# Patient Record
Sex: Male | Born: 1935 | Race: White | Hispanic: No | Smoking: Former smoker
Health system: Southern US, Community
[De-identification: ages and names within clinical notes are randomized; demographics above are authoritative.]

## PROBLEM LIST (undated history)

## (undated) DIAGNOSIS — I10 Essential (primary) hypertension: Secondary | ICD-10-CM

## (undated) DIAGNOSIS — R4182 Altered mental status, unspecified: Secondary | ICD-10-CM

## (undated) DIAGNOSIS — D649 Anemia, unspecified: Secondary | ICD-10-CM

## (undated) DIAGNOSIS — F0280 Dementia in other diseases classified elsewhere without behavioral disturbance: Secondary | ICD-10-CM

## (undated) DIAGNOSIS — G309 Alzheimer's disease, unspecified: Secondary | ICD-10-CM

## (undated) DIAGNOSIS — H547 Unspecified visual loss: Secondary | ICD-10-CM

## (undated) DIAGNOSIS — H9190 Unspecified hearing loss, unspecified ear: Secondary | ICD-10-CM

## (undated) DIAGNOSIS — H3530 Unspecified macular degeneration: Secondary | ICD-10-CM

## (undated) DIAGNOSIS — Z8719 Personal history of other diseases of the digestive system: Secondary | ICD-10-CM

## (undated) DIAGNOSIS — E785 Hyperlipidemia, unspecified: Secondary | ICD-10-CM

## (undated) DIAGNOSIS — K5792 Diverticulitis of intestine, part unspecified, without perforation or abscess without bleeding: Secondary | ICD-10-CM

## (undated) DIAGNOSIS — M1990 Unspecified osteoarthritis, unspecified site: Secondary | ICD-10-CM

## (undated) HISTORY — DX: Diverticulitis of intestine, part unspecified, without perforation or abscess without bleeding: K57.92

## (undated) HISTORY — DX: Essential (primary) hypertension: I10

## (undated) HISTORY — PX: HERNIA REPAIR: SHX51

## (undated) HISTORY — DX: Unspecified osteoarthritis, unspecified site: M19.90

## (undated) HISTORY — PX: LYMPH NODE DISSECTION: SHX5087

## (undated) HISTORY — DX: Hyperlipidemia, unspecified: E78.5

## (undated) HISTORY — DX: Unspecified visual loss: H54.7

## (undated) HISTORY — DX: Alzheimer's disease, unspecified: G30.9

## (undated) HISTORY — DX: Unspecified hearing loss, unspecified ear: H91.90

## (undated) HISTORY — DX: Altered mental status, unspecified: R41.82

## (undated) HISTORY — DX: Dementia in other diseases classified elsewhere, unspecified severity, without behavioral disturbance, psychotic disturbance, mood disturbance, and anxiety: F02.80

## (undated) HISTORY — DX: Personal history of other diseases of the digestive system: Z87.19

## (undated) HISTORY — DX: Unspecified macular degeneration: H35.30

## (undated) HISTORY — DX: Anemia, unspecified: D64.9

## (undated) HISTORY — PX: APPENDECTOMY: SHX54

---

## 1996-03-03 ENCOUNTER — Encounter: Payer: Self-pay | Admitting: Family Medicine

## 1996-03-06 ENCOUNTER — Encounter: Payer: Self-pay | Admitting: Family Medicine

## 1996-03-06 LAB — CONVERTED CEMR LAB: PSA: 2 ng/mL

## 1998-09-02 ENCOUNTER — Encounter: Payer: Self-pay | Admitting: Family Medicine

## 1998-09-02 LAB — CONVERTED CEMR LAB: PSA: 2.6 ng/mL

## 1999-06-29 ENCOUNTER — Encounter (INDEPENDENT_AMBULATORY_CARE_PROVIDER_SITE_OTHER): Payer: Self-pay | Admitting: Specialist

## 1999-06-29 ENCOUNTER — Other Ambulatory Visit: Admission: RE | Admit: 1999-06-29 | Discharge: 1999-06-29 | Payer: Self-pay | Admitting: Internal Medicine

## 1999-06-29 LAB — HM COLONOSCOPY

## 1999-09-03 ENCOUNTER — Encounter: Payer: Self-pay | Admitting: Family Medicine

## 1999-09-29 ENCOUNTER — Encounter: Payer: Self-pay | Admitting: Family Medicine

## 1999-09-29 LAB — CONVERTED CEMR LAB: PSA: 2.4 ng/mL

## 1999-12-04 HISTORY — PX: FEMORAL HERNIA REPAIR: SHX632

## 2000-07-01 ENCOUNTER — Encounter: Admission: RE | Admit: 2000-07-01 | Discharge: 2000-07-01 | Payer: Self-pay | Admitting: Surgery

## 2000-07-01 ENCOUNTER — Encounter: Payer: Self-pay | Admitting: Surgery

## 2000-07-03 ENCOUNTER — Encounter (INDEPENDENT_AMBULATORY_CARE_PROVIDER_SITE_OTHER): Payer: Self-pay | Admitting: *Deleted

## 2000-07-03 ENCOUNTER — Ambulatory Visit (HOSPITAL_BASED_OUTPATIENT_CLINIC_OR_DEPARTMENT_OTHER): Admission: RE | Admit: 2000-07-03 | Discharge: 2000-07-04 | Payer: Self-pay | Admitting: Surgery

## 2000-11-02 ENCOUNTER — Encounter: Payer: Self-pay | Admitting: Family Medicine

## 2000-11-07 ENCOUNTER — Encounter: Payer: Self-pay | Admitting: Family Medicine

## 2001-04-02 ENCOUNTER — Encounter: Payer: Self-pay | Admitting: Family Medicine

## 2001-04-02 LAB — CONVERTED CEMR LAB: PSA: 1.6 ng/mL

## 2001-04-29 ENCOUNTER — Encounter: Payer: Self-pay | Admitting: Family Medicine

## 2002-04-14 ENCOUNTER — Emergency Department (HOSPITAL_COMMUNITY): Admission: EM | Admit: 2002-04-14 | Discharge: 2002-04-14 | Payer: Self-pay | Admitting: Emergency Medicine

## 2002-04-14 ENCOUNTER — Encounter: Payer: Self-pay | Admitting: Emergency Medicine

## 2002-06-02 ENCOUNTER — Encounter: Payer: Self-pay | Admitting: Family Medicine

## 2002-12-03 DIAGNOSIS — Z8719 Personal history of other diseases of the digestive system: Secondary | ICD-10-CM

## 2002-12-03 HISTORY — DX: Personal history of other diseases of the digestive system: Z87.19

## 2003-02-05 ENCOUNTER — Inpatient Hospital Stay (HOSPITAL_COMMUNITY): Admission: EM | Admit: 2003-02-05 | Discharge: 2003-02-08 | Payer: Self-pay | Admitting: Emergency Medicine

## 2003-09-03 ENCOUNTER — Encounter: Payer: Self-pay | Admitting: Family Medicine

## 2003-09-16 ENCOUNTER — Encounter: Payer: Self-pay | Admitting: Family Medicine

## 2003-09-16 LAB — CONVERTED CEMR LAB: TSH: 1.87 microintl units/mL

## 2004-06-21 ENCOUNTER — Emergency Department (HOSPITAL_COMMUNITY): Admission: EM | Admit: 2004-06-21 | Discharge: 2004-06-21 | Payer: Self-pay | Admitting: Emergency Medicine

## 2004-10-31 ENCOUNTER — Ambulatory Visit: Payer: Self-pay | Admitting: Family Medicine

## 2005-09-03 ENCOUNTER — Ambulatory Visit: Payer: Self-pay | Admitting: Family Medicine

## 2005-09-03 LAB — CONVERTED CEMR LAB
Blood Glucose, Fasting: 118 mg/dL
RBC count: 4.61 10*6/uL

## 2005-10-09 ENCOUNTER — Ambulatory Visit: Payer: Self-pay | Admitting: Family Medicine

## 2007-07-07 ENCOUNTER — Telehealth: Payer: Self-pay | Admitting: Family Medicine

## 2007-07-31 ENCOUNTER — Encounter: Payer: Self-pay | Admitting: Family Medicine

## 2007-07-31 DIAGNOSIS — K644 Residual hemorrhoidal skin tags: Secondary | ICD-10-CM | POA: Insufficient documentation

## 2007-07-31 DIAGNOSIS — E785 Hyperlipidemia, unspecified: Secondary | ICD-10-CM | POA: Insufficient documentation

## 2007-07-31 DIAGNOSIS — F528 Other sexual dysfunction not due to a substance or known physiological condition: Secondary | ICD-10-CM | POA: Insufficient documentation

## 2007-07-31 DIAGNOSIS — K5730 Diverticulosis of large intestine without perforation or abscess without bleeding: Secondary | ICD-10-CM | POA: Insufficient documentation

## 2007-07-31 DIAGNOSIS — M1990 Unspecified osteoarthritis, unspecified site: Secondary | ICD-10-CM | POA: Insufficient documentation

## 2007-07-31 DIAGNOSIS — I10 Essential (primary) hypertension: Secondary | ICD-10-CM | POA: Insufficient documentation

## 2007-07-31 DIAGNOSIS — Z8719 Personal history of other diseases of the digestive system: Secondary | ICD-10-CM

## 2007-08-13 ENCOUNTER — Ambulatory Visit: Payer: Self-pay | Admitting: Family Medicine

## 2007-09-26 ENCOUNTER — Ambulatory Visit: Payer: Self-pay | Admitting: Family Medicine

## 2008-09-07 ENCOUNTER — Ambulatory Visit: Payer: Self-pay | Admitting: Family Medicine

## 2008-09-27 ENCOUNTER — Telehealth: Payer: Self-pay | Admitting: Family Medicine

## 2008-09-29 ENCOUNTER — Encounter (INDEPENDENT_AMBULATORY_CARE_PROVIDER_SITE_OTHER): Payer: Self-pay | Admitting: *Deleted

## 2009-03-14 ENCOUNTER — Ambulatory Visit: Payer: Self-pay | Admitting: Family Medicine

## 2009-03-14 LAB — CONVERTED CEMR LAB
ALT: 14 units/L (ref 0–53)
AST: 19 units/L (ref 0–37)
Alkaline Phosphatase: 59 units/L (ref 39–117)
BUN: 22 mg/dL (ref 6–23)
CO2: 32 meq/L (ref 19–32)
Chloride: 105 meq/L (ref 96–112)
Cholesterol: 138 mg/dL (ref 0–200)
Creatinine, Ser: 1.2 mg/dL (ref 0.4–1.5)
Eosinophils Absolute: 0.1 10*3/uL (ref 0.0–0.7)
Glucose, Bld: 90 mg/dL (ref 70–99)
LDL Cholesterol: 83 mg/dL (ref 0–99)
MCHC: 35.2 g/dL (ref 30.0–36.0)
MCV: 82.8 fL (ref 78.0–100.0)
Monocytes Absolute: 0.5 10*3/uL (ref 0.1–1.0)
Neutrophils Relative %: 71.3 % (ref 43.0–77.0)
Platelets: 212 10*3/uL (ref 150.0–400.0)
RDW: 15.2 % — ABNORMAL HIGH (ref 11.5–14.6)
TSH: 1.93 microintl units/mL (ref 0.35–5.50)
Total Bilirubin: 0.8 mg/dL (ref 0.3–1.2)
Total Protein: 6.5 g/dL (ref 6.0–8.3)
Triglycerides: 71 mg/dL (ref 0.0–149.0)
WBC: 6.7 10*3/uL (ref 4.5–10.5)

## 2009-03-16 ENCOUNTER — Ambulatory Visit: Payer: Self-pay | Admitting: Family Medicine

## 2009-04-19 ENCOUNTER — Ambulatory Visit: Payer: Self-pay | Admitting: Internal Medicine

## 2009-04-19 ENCOUNTER — Inpatient Hospital Stay (HOSPITAL_COMMUNITY): Admission: EM | Admit: 2009-04-19 | Discharge: 2009-04-22 | Payer: Self-pay | Admitting: Emergency Medicine

## 2009-06-12 ENCOUNTER — Emergency Department: Payer: Self-pay | Admitting: Emergency Medicine

## 2009-06-13 ENCOUNTER — Telehealth: Payer: Self-pay | Admitting: Internal Medicine

## 2009-06-20 ENCOUNTER — Encounter: Payer: Self-pay | Admitting: Family Medicine

## 2009-06-21 ENCOUNTER — Encounter: Payer: Self-pay | Admitting: Family Medicine

## 2009-06-22 ENCOUNTER — Encounter: Payer: Self-pay | Admitting: Family Medicine

## 2009-06-29 ENCOUNTER — Encounter: Payer: Self-pay | Admitting: Family Medicine

## 2009-06-30 ENCOUNTER — Encounter: Payer: Self-pay | Admitting: Internal Medicine

## 2009-06-30 ENCOUNTER — Ambulatory Visit: Payer: Self-pay | Admitting: Internal Medicine

## 2009-06-30 DIAGNOSIS — G309 Alzheimer's disease, unspecified: Secondary | ICD-10-CM

## 2009-06-30 DIAGNOSIS — F0280 Dementia in other diseases classified elsewhere without behavioral disturbance: Secondary | ICD-10-CM

## 2009-06-30 DIAGNOSIS — R443 Hallucinations, unspecified: Secondary | ICD-10-CM

## 2009-07-20 ENCOUNTER — Telehealth: Payer: Self-pay | Admitting: Internal Medicine

## 2009-07-25 ENCOUNTER — Encounter: Payer: Self-pay | Admitting: Internal Medicine

## 2009-08-12 ENCOUNTER — Encounter: Payer: Self-pay | Admitting: Family Medicine

## 2009-10-06 ENCOUNTER — Encounter: Payer: Self-pay | Admitting: Internal Medicine

## 2009-10-06 ENCOUNTER — Ambulatory Visit: Payer: Self-pay | Admitting: Internal Medicine

## 2009-10-10 ENCOUNTER — Encounter: Payer: Self-pay | Admitting: Family Medicine

## 2009-10-11 ENCOUNTER — Encounter: Payer: Self-pay | Admitting: Family Medicine

## 2009-11-13 ENCOUNTER — Emergency Department: Payer: Self-pay | Admitting: Internal Medicine

## 2009-11-24 ENCOUNTER — Encounter: Payer: Self-pay | Admitting: Internal Medicine

## 2009-12-01 ENCOUNTER — Encounter: Payer: Self-pay | Admitting: Internal Medicine

## 2009-12-06 ENCOUNTER — Encounter: Payer: Self-pay | Admitting: Family Medicine

## 2009-12-08 ENCOUNTER — Encounter: Payer: Self-pay | Admitting: Family Medicine

## 2009-12-20 ENCOUNTER — Encounter: Payer: Self-pay | Admitting: Family Medicine

## 2009-12-20 ENCOUNTER — Ambulatory Visit: Payer: Self-pay | Admitting: Family Medicine

## 2010-01-02 ENCOUNTER — Encounter: Payer: Self-pay | Admitting: Internal Medicine

## 2010-01-02 ENCOUNTER — Telehealth: Payer: Self-pay | Admitting: Internal Medicine

## 2010-02-03 ENCOUNTER — Encounter: Payer: Self-pay | Admitting: Internal Medicine

## 2010-02-15 ENCOUNTER — Ambulatory Visit: Payer: Self-pay | Admitting: Internal Medicine

## 2010-02-22 ENCOUNTER — Encounter: Payer: Self-pay | Admitting: Internal Medicine

## 2010-02-27 ENCOUNTER — Telehealth: Payer: Self-pay | Admitting: Internal Medicine

## 2010-02-27 ENCOUNTER — Encounter: Payer: Self-pay | Admitting: Internal Medicine

## 2010-03-02 ENCOUNTER — Encounter: Payer: Self-pay | Admitting: Internal Medicine

## 2010-03-02 ENCOUNTER — Ambulatory Visit: Payer: Self-pay | Admitting: Internal Medicine

## 2010-03-08 ENCOUNTER — Telehealth: Payer: Self-pay | Admitting: Internal Medicine

## 2010-03-09 ENCOUNTER — Encounter: Payer: Self-pay | Admitting: Internal Medicine

## 2010-03-09 ENCOUNTER — Telehealth: Payer: Self-pay | Admitting: Internal Medicine

## 2010-03-09 ENCOUNTER — Telehealth: Payer: Self-pay | Admitting: Family Medicine

## 2010-03-09 DIAGNOSIS — M25539 Pain in unspecified wrist: Secondary | ICD-10-CM | POA: Insufficient documentation

## 2010-03-27 ENCOUNTER — Encounter: Payer: Self-pay | Admitting: Internal Medicine

## 2010-04-03 ENCOUNTER — Telehealth: Payer: Self-pay | Admitting: Internal Medicine

## 2010-04-03 ENCOUNTER — Encounter: Payer: Self-pay | Admitting: Internal Medicine

## 2010-04-04 ENCOUNTER — Encounter: Payer: Self-pay | Admitting: Internal Medicine

## 2010-04-14 ENCOUNTER — Ambulatory Visit: Payer: Self-pay | Admitting: Internal Medicine

## 2010-05-08 ENCOUNTER — Encounter: Payer: Self-pay | Admitting: Internal Medicine

## 2010-05-16 ENCOUNTER — Encounter: Payer: Self-pay | Admitting: Internal Medicine

## 2010-05-22 ENCOUNTER — Encounter: Payer: Self-pay | Admitting: Internal Medicine

## 2010-06-01 ENCOUNTER — Encounter: Payer: Self-pay | Admitting: Internal Medicine

## 2010-06-19 ENCOUNTER — Ambulatory Visit: Payer: Self-pay | Admitting: Internal Medicine

## 2010-07-27 ENCOUNTER — Ambulatory Visit: Payer: Self-pay | Admitting: Internal Medicine

## 2010-07-27 ENCOUNTER — Encounter: Payer: Self-pay | Admitting: Internal Medicine

## 2010-07-27 DIAGNOSIS — M109 Gout, unspecified: Secondary | ICD-10-CM | POA: Insufficient documentation

## 2010-08-08 ENCOUNTER — Ambulatory Visit: Payer: Self-pay | Admitting: Internal Medicine

## 2010-08-25 ENCOUNTER — Telehealth: Payer: Self-pay | Admitting: Internal Medicine

## 2010-09-02 HISTORY — PX: HIP FRACTURE SURGERY: SHX118

## 2010-09-26 ENCOUNTER — Telehealth: Payer: Self-pay | Admitting: Internal Medicine

## 2010-09-26 ENCOUNTER — Inpatient Hospital Stay: Payer: Self-pay | Admitting: General Practice

## 2010-09-28 ENCOUNTER — Encounter: Payer: Self-pay | Admitting: Internal Medicine

## 2010-09-29 ENCOUNTER — Encounter: Payer: Self-pay | Admitting: Internal Medicine

## 2010-09-29 LAB — PATHOLOGY REPORT

## 2010-12-14 ENCOUNTER — Encounter: Payer: Self-pay | Admitting: Internal Medicine

## 2010-12-15 ENCOUNTER — Telehealth: Payer: Self-pay | Admitting: Internal Medicine

## 2010-12-18 ENCOUNTER — Encounter: Payer: Self-pay | Admitting: Internal Medicine

## 2010-12-21 ENCOUNTER — Encounter: Payer: Self-pay | Admitting: Internal Medicine

## 2010-12-21 ENCOUNTER — Emergency Department: Payer: Self-pay | Admitting: Emergency Medicine

## 2010-12-25 ENCOUNTER — Telehealth: Payer: Self-pay | Admitting: Internal Medicine

## 2010-12-28 ENCOUNTER — Encounter: Payer: Self-pay | Admitting: Internal Medicine

## 2010-12-29 ENCOUNTER — Ambulatory Visit
Admission: RE | Admit: 2010-12-29 | Discharge: 2010-12-29 | Payer: Self-pay | Source: Home / Self Care | Attending: Internal Medicine | Admitting: Internal Medicine

## 2010-12-29 ENCOUNTER — Encounter: Payer: Self-pay | Admitting: Internal Medicine

## 2010-12-29 DIAGNOSIS — R1319 Other dysphagia: Secondary | ICD-10-CM | POA: Insufficient documentation

## 2011-01-01 ENCOUNTER — Telehealth: Payer: Self-pay | Admitting: Internal Medicine

## 2011-01-01 ENCOUNTER — Encounter: Payer: Self-pay | Admitting: Internal Medicine

## 2011-01-02 ENCOUNTER — Telehealth: Payer: Self-pay | Admitting: Internal Medicine

## 2011-01-02 NOTE — Miscellaneous (Signed)
Summary: Materials engineer ,Episode Summary Report  [facility] ,Episode Summary Report   Imported By: Beau Fanny 12/12/2009 15:57:14  _____________________________________________________________________  External Attachment:    Type:   Image     Comment:   External Document

## 2011-01-02 NOTE — Miscellaneous (Signed)
Summary: Episode Summary/[facility]  Ep[facility] Summary/[facility]   Imported By: Lanelle Bal 04/11/2010 10:22:03  _____________________________________________________________________  External Attachment:    Type:   Image     Comment:   External Document

## 2011-01-02 NOTE — Miscellaneous (Signed)
Summary: [facility]   Imported By: Lanelle Bal 05/26/2010 10:22:45  _____________________________________________________________________  External Attachment:    Type:   Image     Comment:   External Document

## 2011-01-02 NOTE — Progress Notes (Signed)
Summary: Rx Theravim-M and Aspirin  Phone Note Refill Request Call back at [phone] Message from:  [facility] on January 02, 2010 5:00 PM  Refills Requested: Medication #1:  BAYER LOW STRENGTH 81 MG  TBEC 1 1 daily by mouth Received refill request for Theravim-m for Multivitamin with miner, take one tablet by mouth once daily for supplement. Forms are in your in box   Method Requested: Fax to [facility] Pharmacy Initial call taken by: Sydell Axon LPN,  January 02, 2010 5:02 PM  Follow-up for Phone Call        okay x 1 year forms signed Follow-up by: Cindee Salt MD,  January 02, 2010 5:25 PM  Additional Follow-up for Phone Call Additional follow up Details #1::        Rx faxed to pharmacy Additional Follow-up by: DeShannon Smith CMA Duncan Dull),  January 02, 2010 5:35 PM    Prescriptions: BAYER LOW STRENGTH 81 MG  TBEC (ASPIRIN) 1 1 daily by mouth  #30 x 12   Entered by:   Mervin Hack CMA (AAMA)   Authorized by:   Cindee Salt MD   Signed by:   Mervin Hack CMA (AAMA) on 01/02/2010   Method used:   Faxed to ...       [facility] (mail-order)       [address]       [place], [place]  [address]       Ph: [phone]       Fax: [phone]   RxID:   [id number] MULTIVITAMINS   TABS (MULTIPLE VITAMIN) 1 once daily  #30 x 12   Entered by:   Mervin Hack CMA (AAMA)   Authorized by:   Cindee Salt MD   Signed by:   Mervin Hack CMA (AAMA) on 01/02/2010   Method used:   Faxed to ...       [facility] (mail-order)       [address]       [place], [place]  [address]       Ph: [phone]       Fax: [phone]   RxID:   [id number]
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
## 2011-01-02 NOTE — Miscellaneous (Signed)
Summary: Care Plan/[facility]  Care Plan[facility]   Imported By: Lanelle Bal 08/14/2010 11:57:38  _____________________________________________________________________  External Attachment:    Type:   Image     Comment:   External Document

## 2011-01-02 NOTE — Miscellaneous (Signed)
Summary: [place]-2, Standing Orders, Care Plan/[facility]  [place], Standing Orders, Care Plan/[facility]   Imported By: Maryln Gottron 06/07/2010 14:01:46  _____________________________________________________________________  External Attachment:    Type:   Image     Comment:   External Document

## 2011-01-02 NOTE — Progress Notes (Signed)
Summary: pt fell, fx'd hip  Phone Note From Other Clinic   Caller: Debbie at [facility]  [phone] Summary of Call: Pt fell last night and broke his hip.  He is at [facility]. Initial call taken by: Lowella Petties CMA, AAMA,  September 26, 2010 10:09 AM  Follow-up for Phone Call        noted will await his rehab status  Please ask Eunice Blase to call when we know where he is going for rehab and whether he is coming back to the [facility] Follow-up by: Cindee Salt MD,  September 26, 2010 10:34 AM  Additional Follow-up for Phone Call Additional follow up Details #1::        Spoke with Eunice Blase and advised results, she will call. Additional Follow-up by: Mervin Hack CMA Duncan Dull),  September 26, 2010 11:57 AM

## 2011-01-02 NOTE — Miscellaneous (Signed)
Summary: Physician Verbal Order/[facility]  Physician Verbal Order/[facility]   Imported By: Maryln Gottron 10/05/2010 15:30:37  _____________________________________________________________________  External Attachment:    Type:   Image     Comment:   External Document

## 2011-01-02 NOTE — Miscellaneous (Signed)
Summary: B12 Order/[facility]  B12 Order/[facility]   Imported By: Lanelle Bal 05/15/2010 08:37:28  _____________________________________________________________________  External Attachment:    Type:   Image     Comment:   External Document

## 2011-01-02 NOTE — Progress Notes (Signed)
Summary: refill request for dermacloud  Phone Note Refill Request Message from:  Fax from Pharmacy  Refills Requested: Medication #1:  dermacloud   Last Refilled: 06/23/2009 Faxed request from [facility] is on your desk, this is not on med list.  Initial call taken by: Lowella Petties CMA,  August 25, 2010 4:54 PM  Follow-up for Phone Call        okay x 1 year Follow-up by: Cindee Salt MD,  August 25, 2010 5:18 PM  Additional Follow-up for Phone Call Additional follow up Details #1::        Completed rx form faxed back to [facility]. Additional Follow-up by: Sydell Axon LPN,  August 28, 2010 9:02 AM    New/Updated Medications: DERMACLOUD  CREA ([employer]ODUCTS) apply to buttocks two times a day as needed Prescriptions: DERMACLOUD  CREA ([employer]ODUCTS) apply to buttocks two times a day as needed  #1 x 11   Entered by:   Sydell Axon LPN   Authorized by:   Cindee Salt MD   Signed by:   Sydell Axon LPN on 16/09/9603   Method used:   Telephoned to ...       [facility]* (retail)       [address]       [place], [place]  [address]       Ph: [phone]       Fax: [phone]   RxID:   [id number]

## 2011-01-02 NOTE — Progress Notes (Signed)
Summary: Cough, fever, congestion  Phone Note Other Incoming   Caller: Debbie @ [facility] [address] [phone] Summary of Call: Eunice Blase calling stating that pt has fever, congestion and cough. Please advise.  ** she states that she has 7 residents with the same symptoms, only 4 are yours. Initial call taken by: Mervin Hack CMA Duncan Dull),  February 27, 2010 9:48 AM  Follow-up for Phone Call        Discussed with Eunice Blase the director  he is the worst of all with chest congestion and wheezing  will use empiric antibiotic and see on Thursday Order sent for z- pak Follow-up by: Cindee Salt MD,  February 27, 2010 2:22 PM  Additional Follow-up for Phone Call Additional follow up Details #1::        order faxed back to debbie [phone] Additional Follow-up by: Mervin Hack CMA Duncan Dull),  February 27, 2010 2:43 PM

## 2011-01-02 NOTE — Medication Information (Signed)
Summary: Fax Regarding Risperidone/[facility]  Fax Regarding Risperidone/[facility]   Imported By: Lanelle Bal 12/16/2009 13:16:39  _____________________________________________________________________  External Attachment:    Type:   Image     Comment:   External Document

## 2011-01-02 NOTE — Miscellaneous (Signed)
Summary: Episode Summary/[facility]  Episode Summary[facility]   Imported By: Lanelle Bal 02/13/2010 11:12:09  _____________________________________________________________________  External Attachment:    Type:   Image     Comment:   External Document

## 2011-01-02 NOTE — Miscellaneous (Signed)
Summary: Request for Medication change/[facility]  Request for Medication change/[facility]   Imported By: Maryln Gottron 02/28/2010 10:02:03  _____________________________________________________________________  External Attachment:    Type:   Image     Comment:   External Document

## 2011-01-02 NOTE — Miscellaneous (Signed)
Summary: Episode Summary/[facility]  Episode Summary[facility]   Imported By: Lanelle Bal 10/10/2010 08:20:57  _____________________________________________________________________  External Attachment:    Type:   Image     Comment:   External Document

## 2011-01-02 NOTE — Miscellaneous (Signed)
Summary: Care Plan/[facility]  Care Plan[facility]   Imported By: Lanelle Bal 06/23/2010 10:19:40  _____________________________________________________________________  External Attachment:    Type:   Image     Comment:   External Document

## 2011-01-02 NOTE — Miscellaneous (Signed)
Summary: Certification and Plan of Care/[facility]  Certification and Plan of Care/[facility]   Imported By: Maryln Gottron 02/20/2010 10:26:11  _____________________________________________________________________  External Attachment:    Type:   Image     Comment:   External Document

## 2011-01-02 NOTE — Miscellaneous (Signed)
Summary: [facility] [place] Orders  [facility] [place] [facility]   Imported By: Beau Fanny 12/12/2009 15:58:02  _____________________________________________________________________  External Attachment:    Type:   Image     Comment:   External Document

## 2011-01-02 NOTE — Miscellaneous (Signed)
Summary: Zpak Order/[facility]  Zpak Order/[facility]   Imported By: Lanelle Bal 03/02/2010 14:04:38  _____________________________________________________________________  External Attachment:    Type:   Image     Comment:   External Document

## 2011-01-02 NOTE — Progress Notes (Signed)
Summary: Cough/Congestion  Phone Note From Other Clinic Call back at ph [phone] fax [phone]   Caller: [facility] Call For: Dr. Alphonsus Sias Summary of Call: Patient has a cough that is not productive and he is very congested, no fever.  Claris Che wants to know if he can have something for the cough and congestion.  Please advise.   Initial call taken by: Linde Gillis CMA Duncan Dull),  March 08, 2010 10:50 AM  Follow-up for Phone Call        Okay robitussin for the cough--isn't this on standing orders??  no meds for congestion--too many side effects Follow-up by: Cindee Salt MD,  March 08, 2010 2:04 PM  Additional Follow-up for Phone Call Additional follow up Details #1::        Spoke with Claris Che and advised results.  Additional Follow-up by: Mervin Hack CMA Duncan Dull),  March 08, 2010 2:57 PM

## 2011-01-02 NOTE — Miscellaneous (Signed)
Summary: Care Plan/[facility]  Care Plan/[facility]   Imported By: Lanelle Bal 04/19/2010 11:59:40  _____________________________________________________________________  External Attachment:    Type:   Image     Comment:   External Document

## 2011-01-02 NOTE — Miscellaneous (Signed)
Summary: D/C Vicodin/[facility]  D/C Vicodin/[facility]   Imported By: Lanelle Bal 01/06/2010 09:49:18  _____________________________________________________________________  External Attachment:    Type:   Image     Comment:   External Document

## 2011-01-02 NOTE — Miscellaneous (Signed)
Summary: Order/[facility]  Order/[facility]   Imported By: Lester Morrison 05/20/2010 11:06:05  _____________________________________________________________________  External Attachment:    Type:   Image     Comment:   External Document

## 2011-01-02 NOTE — Miscellaneous (Signed)
Summary: Risperdal Order/[facility]  Risperdal Order/[facility]   Imported By: Lanelle Bal 04/07/2010 09:16:14  _____________________________________________________________________  External Attachment:    Type:   Image     Comment:   External Document

## 2011-01-02 NOTE — Miscellaneous (Signed)
Summary: Eval Order/[facility]  Eval Order/[facility]   Imported By: Lanelle Bal 04/03/2010 08:44:23  _____________________________________________________________________  External Attachment:    Type:   Image     Comment:   External Document

## 2011-01-02 NOTE — Progress Notes (Signed)
Summary: [facility]  Phone Note Call from Patient Call back at [phone]   Caller: Daughter Summary of Call: Aaron Mose called and she took her father to [facility]. They did xrays and his wrist is not broken . They said that he has bad arthriris and gave him  a RX for Mobic she thinks. She was wondering if this will work for the arthiritis or if you would suggest something else for the arthritis. He couldnt take the celebrex anymore due to bleeding ulcer.  Initial call taken by: Carlton Adam,  March 09, 2010 2:19 PM  Follow-up for Phone Call        he should not take the mobic if he had bleeding with the celebrex---these are very similar meds  will try tramadol 50mg   1 three times a day as needed for pain  please fax order to the [facility] Follow-up by: Cindee Salt MD,  March 09, 2010 3:01 PM  Additional Follow-up for Phone Call Additional follow up Details #1::        [facility] Additional Follow-up by: Mervin Hack CMA Duncan Dull),  March 09, 2010 3:17 PM

## 2011-01-02 NOTE — Miscellaneous (Signed)
Summary: Physician's Orders/[facility]  Physician's Orders/[facility]   Imported By: Maryln Gottron 03/13/2010 15:57:31  _____________________________________________________________________  External Attachment:    Type:   Image     Comment:   External Document

## 2011-01-02 NOTE — Progress Notes (Signed)
Summary: Injury after fall  Phone Note Call from Patient Call back at Home Phone [phone]   Caller: Daughter/Pat Call For: Cindee Salt MD Summary of Call: Remi Deter fell night before last at [facility]  He is now complaining of his hand hurting and does not want anyone to touch it.  He hurt his hand back in October and was taken to the ER, they didn't find any fracture so they just treated it as arthritis.  Please advise, Dr. Alphonsus Sias not in until this afternoon. Initial call taken by: Linde Gillis CMA Duncan Dull),  March 09, 2010 8:19 AM  Follow-up for Phone Call        Have pt seen by [facility]. Order sent. Follow-up by: Shaune Leeks MD,  March 09, 2010 8:35 AM  Additional Follow-up for Phone Call Additional follow up Details #1::        Patient's daughter notified that Shirlee Limerick will be in touch with her regarding the appt. Additional Follow-up by: Sydell Axon LPN,  March 09, 2010 8:38 AM  New Problems: WRIST PAIN [phone])   Additional Follow-up for Phone Call Additional follow up Details #2::    Called [facility] and [facility] neither group would see Mr Cadieux today. [facility] recommended that they take him to the [facility] clinic and they would Xray his hand and refer if needed to the Ortho at [facility]. Daughter will call back with what happened and if they need anything else.  Follow-up by: Carlton Adam,  March 09, 2010 9:03 AM  New Problems: WRIST PAIN [phone])

## 2011-01-02 NOTE — Miscellaneous (Signed)
Summary: Care Plan/[facility]  Care Plan[facility]   Imported By: Lanelle Bal 12/23/2009 09:15:23  _____________________________________________________________________  External Attachment:    Type:   Image     Comment:   External Document

## 2011-01-02 NOTE — Progress Notes (Signed)
Summary: Patient's behavior  Phone Note Other Incoming   Caller: Debbie from [employer] of Call: states pt is becoming more "frisky" trying to touch residents and staff. Eunice Blase thinks it's because he has come off the risperdal. Please advise. Initial call taken by: Mervin Hack CMA Duncan Dull),  Apr 03, 2010 5:33 PM  Follow-up for Phone Call        will go back to risperdal 0.5mg  two times a day   order written Follow-up by: Cindee Salt MD,  Apr 03, 2010 5:46 PM  Additional Follow-up for Phone Call Additional follow up Details #1::        [facility] Additional Follow-up by: Mervin Hack CMA Duncan Dull),  Apr 04, 2010 10:02 AM
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
## 2011-01-04 ENCOUNTER — Encounter: Payer: Self-pay | Admitting: Internal Medicine

## 2011-01-04 NOTE — Miscellaneous (Signed)
Summary: Physician's Orders/[facility]  Physician's Orders/[facility]   Imported By: Maryln Gottron 12/25/2010 09:56:28  _____________________________________________________________________  External Attachment:    Type:   Image     Comment:   External Document

## 2011-01-04 NOTE — Miscellaneous (Signed)
Summary: Orders, FL2 & Verifications & Diet Order/[facility]  Orders, FL2 & Verifications & Diet Order/[facility]   Imported By: Lanelle Bal 12/20/2010 15:52:11  _____________________________________________________________________  External Attachment:    Type:   Image     Comment:   External Document

## 2011-01-04 NOTE — Progress Notes (Signed)
Summary: fax from call a nurse  Phone Note Call from Patient   Caller: Fax from call a nurse  Call For: Cindee Salt MD Summary of Call: Received a fax from call a nurse - Cristalyne w/ [facility]  called to let you know that pt was trying to wak and fell down Patient was sent to [facility] , he has a skin tear on right elbow and was sent back to facility.  Initial call taken by: Melody Comas,  December 25, 2010 12:02 PM  Follow-up for Phone Call        noted Follow-up by: Cindee Salt MD,  December 25, 2010 1:40 PM

## 2011-01-04 NOTE — Progress Notes (Signed)
Summary: needs order for physical therapy  Phone Note From Other Clinic   Caller: Amy Moon, physical therapist at [facility] [phone] Summary of Call: Pt was opened to physical therapy and therapist needs a verbal ok to continue with plan of care- working on transfers, mobility. Initial call taken by: Lowella Petties CMA, AAMA,  December 15, 2010 8:37 AM  Follow-up for Phone Call        okay to proceed as planned Cindee Salt MD  December 15, 2010 9:18 AM   Verbal ok given to therapist, Gaye Alken. Follow-up by: Lowella Petties CMA, AAMA,  December 15, 2010 9:42 AM
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
## 2011-01-04 NOTE — Medication Information (Signed)
Summary: Rxs coverage/[employer]  Rxs coverage/[employer]   Imported By: Lester  12/26/2010 08:04:12  _____________________________________________________________________  External Attachment:    Type:   Image     Comment:   External Document

## 2011-01-04 NOTE — Miscellaneous (Signed)
Summary: Physician's Orders/[facility]  Physician's Orders/[facility]   Imported By: Maryln Gottron 12/26/2010 15:12:01  _____________________________________________________________________  External Attachment:    Type:   Image     Comment:   External Document

## 2011-01-05 ENCOUNTER — Encounter: Payer: Self-pay | Admitting: Internal Medicine

## 2011-01-08 ENCOUNTER — Telehealth: Payer: Self-pay | Admitting: Internal Medicine

## 2011-01-08 ENCOUNTER — Encounter: Payer: Self-pay | Admitting: Internal Medicine

## 2011-01-10 NOTE — Progress Notes (Signed)
Summary: request to change diet  Phone Note From Other Clinic   Caller: Lafe Garin at [facility] Summary of Call: Nurse at [facility] called to report that they want to change pt back to pureed diet, she said he has had complications with soft diet- choking, spitting out food.  She has faxed you a request to change, this is on your desk. Initial call taken by: Lowella Petties CMA, AAMA,  January 01, 2011 2:42 PM  Follow-up for Phone Call        okay to change I felt an attempt at improved diet was warranted  Most likely due to the extent of his dementia Follow-up by: Cindee Salt MD,  January 01, 2011 2:46 PM  Additional Follow-up for Phone Call Additional follow up Details #1::        form faxed back and scanned Additional Follow-up by: Mervin Hack CMA Duncan Dull),  January 01, 2011 2:55 PM

## 2011-01-10 NOTE — Progress Notes (Signed)
Summary: call a nurse   Phone Note Call from Patient   Call For: Cindee Salt MD Summary of Call: Triage Record Num: [id number] Operator: Kathleen Lime Patient Name: [name] Schwartz Call Date & Time: 01/01/2011 9:15:21PM Patient Phone: [phone] PCP: Patient Gender: Male PCP Fax : Patient DOB: 29-Jan-1914 Practice Name: [facility] Reason for Call: Duwayne Heck, Med Techr called from [employer] [facility]. Pt fell unwitnessed from wheelchair. Has some skin tears, lacerations on L hand. They are only in the skin, not deep. Happend tonight. Wound was cleaned and bandaged, Pt given Tylenol for pain. Protocol(s) Used: Abrasions, Lacerations, Puncture Wounds Protocol(s) Used: Hand Injury Recommended Outcome per Protocol: Provide Home/Self Care Reason for Outcome: Cut, abrasion, laceration or puncture wound is the primary concern Wound does not gape with or without pulling, is superficial or with minor bleeding that is easily controlled with pressure Care Advice:  ~ Avoid injury to affected area. Call provider if wound or area around wound becomes increasingly red or painful, there is a purulent or foul smelling discharge, red streaks develop leading away from wound, or if you develop any temperature elevation.  ~  ~ HEALTH PROMOTION / MAINTENANCE  ~ Thoroughly wash hands with soap and water before and after touching the site. Do not pick at any scab that forms over during the healing process. Allow it to fall off naturally. Keep the area clean and dry.  ~ Cleanse with water. Cover with a bandage across the cut to keep edges in contact. Apply direct pressure to the wound to control bleeding.  ~  ~ SYMPTOM / CONDITION MANAGEMENT  ~ CAUTIONS Tetanus immunization must be given as soon as possible after injury, usually within 72 hours, IF: - immunization status is unknown, never immunized, or fewer than 3 doses given, - it has been 10 years or more since last  immunization; - OR if it has been 5 years or more since last booster, AND wound is a deep, is a puncture wound, or is a tetanus-prone wound. Keep an up-to-date record of yo Initial call taken by: Melody Comas,  January 02, 2011 8:38 AM  Follow-up for Phone Call        noted Follow-up by: Cindee Salt MD,  January 02, 2011 8:58 AM

## 2011-01-10 NOTE — Miscellaneous (Signed)
Summary: D N R  D N R   Imported By: Kassie Mends 01/05/2011 10:55:32  _____________________________________________________________________  External Attachment:    Type:   Image     Comment:   External Document

## 2011-01-11 ENCOUNTER — Encounter: Payer: Self-pay | Admitting: Internal Medicine

## 2011-01-12 ENCOUNTER — Encounter: Payer: Self-pay | Admitting: Internal Medicine

## 2011-01-13 ENCOUNTER — Encounter: Payer: Self-pay | Admitting: Internal Medicine

## 2011-01-14 ENCOUNTER — Emergency Department: Payer: Self-pay | Admitting: Emergency Medicine

## 2011-01-15 ENCOUNTER — Telehealth: Payer: Self-pay | Admitting: Internal Medicine

## 2011-01-15 ENCOUNTER — Encounter: Payer: Self-pay | Admitting: Internal Medicine

## 2011-01-18 ENCOUNTER — Encounter: Payer: Self-pay | Admitting: Internal Medicine

## 2011-01-18 DIAGNOSIS — Z471 Aftercare following joint replacement surgery: Secondary | ICD-10-CM

## 2011-01-18 DIAGNOSIS — M1610 Unilateral primary osteoarthritis, unspecified hip: Secondary | ICD-10-CM

## 2011-01-18 DIAGNOSIS — I10 Essential (primary) hypertension: Secondary | ICD-10-CM

## 2011-01-18 DIAGNOSIS — IMO0001 Reserved for inherently not codable concepts without codable children: Secondary | ICD-10-CM

## 2011-01-18 DIAGNOSIS — M6281 Muscle weakness (generalized): Secondary | ICD-10-CM

## 2011-01-18 DIAGNOSIS — F0280 Dementia in other diseases classified elsewhere without behavioral disturbance: Secondary | ICD-10-CM

## 2011-01-18 DIAGNOSIS — G309 Alzheimer's disease, unspecified: Secondary | ICD-10-CM

## 2011-01-18 NOTE — Progress Notes (Signed)
Summary: daughter wants to change prevacid  Phone Note Call from Patient Call back at Home Phone [phone]   Caller: Daughter  Alfonso Ellis Summary of Call: Pt's daughter is asking that prevacid be changed to omeprazole, which would be less costly.  Please fax order to [employer] and let daughter know  Also, call from [place], pt slid out of his wheelchair onto the floor, but he is ok, no injuries. Initial call taken by: Lowella Petties CMA, AAMA,  January 08, 2011 9:47 AM  Follow-up for Phone Call        will change they may have to crush tab if allowed--otherwise try liquid (may have to be pantoprazole then  Please check with pharmacist about whether omeprazole can be crushed and if not, which PPI has  liquid form Follow-up by: Cindee Salt MD,  January 08, 2011 1:31 PM  Additional Follow-up for Phone Call Additional follow up Details #1::        left message on machine at home for patient to return my call.  DeShannon Smith CMA Duncan Dull)  January 08, 2011 3:47 PM   spoke with pharmacist at [facility], she states that omeprazole capsule can be opened up and sprinkled onto food. Nexium has unit powder packs for suspension.  DeShannon Smith CMA (AAMA)  January 09, 2011 8:30 AM   Orders changed to omeprazole Cindee Salt MD  January 09, 2011 8:41 AM   orders faxed back to [facility] and rx faxed to [facility] and scanned Additional Follow-up by: Mervin Hack CMA Duncan Dull),  January 09, 2011 9:01 AM    New/Updated Medications: OMEPRAZOLE 20 MG CPDR (OMEPRAZOLE) 1 tab daily  Appended Document: daughter wants to change prevacid daughter called back and I advised results

## 2011-01-18 NOTE — Miscellaneous (Signed)
Summary: [facility] Physician Orders  [facility] Physician Orders   Imported By: Kassie Mends 01/10/2011 10:09:18  _____________________________________________________________________  External Attachment:    Type:   Image     Comment:   External Document

## 2011-01-18 NOTE — Progress Notes (Signed)
Summary: call a nurse   Phone Note Call from Patient   Call For: Cindee Salt MD Summary of Call: Triage Record Num: [id number] Operator: Kerby Moors Patient Name: [name] Schwartz Call Date & Time: 01/05/2011 10:15:24PM Patient Phone: [phone] PCP: Tillman Abide Patient Gender: Male PCP Fax : [phone] Patient DOB: [birth year] Practice Name: [facility] Reason for Call: [employer] calling from [facility] to report that pt was found sitting in the floor on his bottom. Vitals are stable(129/108-historically runs high, 82, 24, 97.6), no redness or bruising, no complaints of pain. Pt did reopen skin tear on Right elbow, area was cleaned and dressing applied. All emergent sxs r/o per protocol, advised to f/u with office on Monday. Also wants to make office aware that she faxed a note about insurance not covering Prevacid and with suggested recommendations that insurance will cover. Protocol(s) Used: Falls Recommended Outcome per Protocol: See Provider within 2 Weeks Reason for Outcome: Potential for nutritional deficiencies Care Advice:  ~ 02/ Initial call taken by: Melody Comas,  January 08, 2011 8:14 AM  Follow-up for Phone Call        Noted will await fax about prevacid Follow-up by: Cindee Salt MD,  January 08, 2011 10:05 AM

## 2011-01-18 NOTE — Miscellaneous (Signed)
Summary: D N R  D N R   Imported By: Kassie Mends 01/08/2011 11:44:42  _____________________________________________________________________  External Attachment:    Type:   Image     Comment:   External Document

## 2011-01-18 NOTE — Miscellaneous (Signed)
Summary: [name] [place] (Pt Orders)  [name] [place] (Pt Orders)   Imported By: Kassie Mends 01/08/2011 10:08:54  _____________________________________________________________________  External Attachment:    Type:   Image     Comment:   External Document

## 2011-01-18 NOTE — Miscellaneous (Signed)
Summary: Advertising copywriter  Plan of Care  [facility]  Plan of Care   Imported By: Kassie Mends 01/10/2011 11:40:10  _____________________________________________________________________  External Attachment:    Type:   Image     Comment:   External Document

## 2011-01-18 NOTE — Miscellaneous (Signed)
Summary: [facility] MD Order Sheet  [facility] MD Order Sheet   Imported By: Kassie Mends 01/08/2011 11:45:32  _____________________________________________________________________  External Attachment:    Type:   Image     Comment:   External Document

## 2011-01-18 NOTE — Miscellaneous (Signed)
Summary: [facility] MD Diet Order  [facility] MD Diet Order   Imported By: Kassie Mends 01/08/2011 11:43:49  _____________________________________________________________________  External Attachment:    Type:   Image     Comment:   External Document

## 2011-01-18 NOTE — Progress Notes (Signed)
Summary: Risperidone  Phone Note Refill Request Message from:  Fax from Pharmacy on January 08, 2011 4:46 PM  Refills Requested: Medication #1:  RISPERIDONE 0.5 MG TABS take 1 by mouth once daily at bedtime [facility]   Phone:   [phone]   Method Requested: Telephone to Pharmacy Initial call taken by: Delilah Shan CMA [facility]),  January 08, 2011 4:47 PM  Follow-up for Phone Call        No fax but okay to refill for 6 months Follow-up by: Cindee Salt MD,  January 08, 2011 5:54 PM  Additional Follow-up for Phone Call Additional follow up Details #1::        Rx faxed to pharmacy Additional Follow-up by: DeShannon Smith CMA [facility]),  January 09, 2011 8:31 AM    Prescriptions: RISPERIDONE 0.5 MG TABS (RISPERIDONE) take 1 by mouth once daily at bedtime  #30 x 6   Entered by:   Mervin Hack CMA (AAMA)   Authorized by:   Cindee Salt MD   Signed by:   Mervin Hack CMA (AAMA) on 01/09/2011   Method used:   Electronically to        [facility]* (retail)       [address]       [place], [place]  [address]       Ph: [phone]       Fax: [phone]   RxID:   [id number]

## 2011-01-19 ENCOUNTER — Encounter: Payer: Self-pay | Admitting: Internal Medicine

## 2011-01-23 ENCOUNTER — Telehealth: Payer: Self-pay | Admitting: Internal Medicine

## 2011-01-23 ENCOUNTER — Emergency Department: Payer: Self-pay | Admitting: Unknown Physician Specialty

## 2011-01-24 ENCOUNTER — Encounter: Payer: Self-pay | Admitting: Internal Medicine

## 2011-01-24 NOTE — Miscellaneous (Signed)
Summary: Office Message for MD  Office Message for MD   Imported By: Kassie Mends 01/16/2011 08:33:25  _____________________________________________________________________  External Attachment:    Type:   Image     Comment:   External Document

## 2011-01-24 NOTE — Progress Notes (Signed)
Summary: call a nurse   Phone Note Call from Patient   Call For: Cindee Salt MD Summary of Call: Triage Record Num: [id number] Operator: Sula Rumple Patient Name: [name] Schwartz Call Date & Time: 01/14/2011 11:27:19PM Patient Phone: [phone] PCP: Tillman Abide Patient Gender: Male PCP Fax : [phone] Patient DOB: November 19, 1914 [facility] Name[facility] Reason for Call: Cristalin/ CNA /calling on 01/14/11 from [facility]/states pt was sent out/pt was found on the floor/head bleeding/126/80/Pulse 83/ respiration 36/temp normal./sent to ER per EMS/calling to report fall only. Protocol(s) Used: Head Injury Recommended Outcome per Protocol: See ED Immediately Reason for Outcome: Head injury AND 75 years of age or older Care Advice:  ~ 02/ Initial call taken by: Melody Comas,  January 15, 2011 8:32 AM  Follow-up for Phone Call        Please check on him Cindee Salt MD  January 15, 2011 10:14 AM   order faxed back to [facility] and scanned Follow-up by: Mervin Hack CMA [facility]),  January 15, 2011 2:06 PM

## 2011-01-24 NOTE — Miscellaneous (Signed)
Summary: [facility] MD Order  [facility] MD Order   Imported By: Kassie Mends 01/15/2011 09:59:07  _____________________________________________________________________  External Attachment:    Type:   Image     Comment:   External Document

## 2011-01-24 NOTE — Letter (Signed)
SummaryScientist, physiological [facility]   [facility]   Imported By: Kassie Mends 01/19/2011 09:10:27  _____________________________________________________________________  External Attachment:    Type:   Image     Comment:   External Document

## 2011-01-24 NOTE — Medication Information (Signed)
Summary: Non Covered Medication note   Non Covered Medication note   Imported By: Kassie Mends 01/15/2011 10:00:14  _____________________________________________________________________  External Attachment:    Type:   Image     Comment:   External Document

## 2011-01-24 NOTE — Miscellaneous (Signed)
[facility]   Imported By: Kassie Mends 01/19/2011 09:12:12  _____________________________________________________________________  External Attachment:    Type:   Image     Comment:   External Document

## 2011-01-24 NOTE — Miscellaneous (Signed)
Summary: Audiological scientist Living Physician Order Insurance risk surveyor Living Physician Order Sheet   Imported By: Kassie Mends 01/19/2011 09:13:31  _____________________________________________________________________  External Attachment:    Type:   Image     Comment:   External Document

## 2011-01-24 NOTE — Miscellaneous (Signed)
Summary: Plan of Treatment  Plan of Treatment   Imported By: Kassie Mends 01/16/2011 08:31:44  _____________________________________________________________________  External Attachment:    Type:   Image     Comment:   External Document

## 2011-01-29 ENCOUNTER — Encounter: Payer: Self-pay | Admitting: Internal Medicine

## 2011-01-30 NOTE — Progress Notes (Signed)
Summary: does patient need to see you  Phone Note Call from Patient   Caller: Tammy Sours from [facility] [phone] Call For: Cindee Salt MD Summary of Call: Patient was sent to ER after falling and hitting his head. He was told to follow up with Dr. Alphonsus Sias in 1-2 days. Patient has no injuries, no fractures, is feeling fine. Tammy Sours is asking if you want to see him in a couple of days or if it is okay to wait and see you at his next follow up which is scheduled for April. Please advise.  Initial call taken by: Melody Comas,  January 23, 2011 3:41 PM  Follow-up for Phone Call        Okay to wait for next appt unless they note anything of concern Follow-up by: Cindee Salt MD,  January 24, 2011 7:56 AM  Additional Follow-up for Phone Call Additional follow up Details #1::        spoke with Tammy Sours from [facility] and advised results. Additional Follow-up by: Mervin Hack CMA [facility]),  January 24, 2011 10:47 AM

## 2011-01-30 NOTE — Miscellaneous (Signed)
Summary: PT Evaluation  PT Evaluation   Imported By: Kassie Mends 01/24/2011 09:34:32  _____________________________________________________________________  External Attachment:    Type:   Image     Comment:   External Document

## 2011-01-30 NOTE — Miscellaneous (Signed)
Summary: OT Orders & Care Plan/[facility]  OT Orders & Care Plan/[facility]   Imported By: Lanelle Bal 01/22/2011 15:02:05  _____________________________________________________________________  External Attachment:    Type:   Image     Comment:   External Document

## 2011-01-31 ENCOUNTER — Encounter: Payer: Self-pay | Admitting: Internal Medicine

## 2011-02-01 ENCOUNTER — Encounter: Payer: Self-pay | Admitting: Internal Medicine

## 2011-02-08 NOTE — Miscellaneous (Signed)
Summary: Discharge from OT/[facility]  Discharge from OT/[facility]   Imported By: Maryln Gottron 01/31/2011 10:54:57  _____________________________________________________________________  External Attachment:    Type:   Image     Comment:   External Document

## 2011-02-08 NOTE — Miscellaneous (Signed)
Summary: Physician's Orders/[facility]  Physician's Orders/[facility]   Imported By: Maryln Gottron 01/29/2011 15:54:15  _____________________________________________________________________  External Attachment:    Type:   Image     Comment:   External Document

## 2011-02-08 NOTE — Miscellaneous (Signed)
Summary: PT/[facility]  PT/[facility]   Imported By: Sherian Rein 01/29/2011 09:24:21  _____________________________________________________________________  External Attachment:    Type:   Image     Comment:   External Document

## 2011-02-12 ENCOUNTER — Encounter: Payer: Self-pay | Admitting: Internal Medicine

## 2011-02-20 NOTE — Miscellaneous (Signed)
Summary: MD Orders   MD Orders   Imported By: Kassie Mends 02/14/2011 08:11:45  _____________________________________________________________________  External Attachment:    Type:   Image     Comment:   External Document

## 2011-02-20 NOTE — Miscellaneous (Signed)
Summary: [name] [facility] MD Orders  [name] [facility] of [place] MD Orders   Imported By: Kassie Mends 02/14/2011 08:11:06  _____________________________________________________________________  External Attachment:    Type:   Image     Comment:   External Document

## 2011-02-26 ENCOUNTER — Telehealth: Payer: Self-pay | Admitting: *Deleted

## 2011-02-26 NOTE — Telephone Encounter (Signed)
Triage Record Num: [id number] Operator: Freddie Breech Patient Name: Lawrnce Reyez Call Date & Time: 02/24/2011 5:16:05PM Patient Phone: [phone] PCP: Tillman Abide Patient Gender: Male PCP Fax : [phone] Patient DOB: 01/05/1914 [facility] Reason for Call: Cristalyn, Med Tech is calling as pt had a fall today. No LOC, lac, bruising or any obvious injury. Alert per baseline. Eating supper at present. Home care advised. Protocol(s) Used: Falls Recommended Outcome per Protocol: Provide Home/Self Care Reason for Outcome: History of falls due to environmental factors AND had previous evaluation Care Advice: ~ Call provider if symptoms continue, worsen, or new symptoms develop. 03/

## 2011-02-26 NOTE — Telephone Encounter (Signed)
No action needed Staff there will observe

## 2011-03-12 ENCOUNTER — Telehealth: Payer: Self-pay | Admitting: *Deleted

## 2011-03-12 NOTE — Telephone Encounter (Signed)
Triage Record Num: [id number] Operator: April Finney Patient Name: [name] Schwartz Call Date & Time: 03/08/2011 9:58:41PM Patient Phone: [phone] PCP: Tillman Abide Patient Gender: Male PCP Fax : [phone] Patient DOB: 1914-04-22 [facility] Name[facility] Reason for Call: Amanda/[facility] [phone] calling about last balance and fell in floor. Has carpet burn to right knee that has been cleaned. No bleeding. No other injuries. Afebrile. No emergent symptoms. See within 2 wks care advice given. Protocol(s) Used: Falls Recommended Outcome per Protocol: See Provider within 2 Weeks Reason for Outcome: At risk for recurrent falling Care Advice: ~ Call provider if symptoms continue, worsen, or new symptoms develop. ~ SYMPTOM / CONDITION MANAGEMENT ~ SAFETY / PROTECTION ~ CAUTIONS 03/08/2011 10:07:43PM Page 1 of 1 [id number]

## 2011-03-13 LAB — DIFFERENTIAL
Basophils Absolute: 0.2 10*3/uL — ABNORMAL HIGH (ref 0.0–0.1)
Basophils Relative: 2 % — ABNORMAL HIGH (ref 0–1)
Monocytes Relative: 7 % (ref 3–12)
Neutro Abs: 8.5 10*3/uL — ABNORMAL HIGH (ref 1.7–7.7)
Neutrophils Relative %: 86 % — ABNORMAL HIGH (ref 43–77)

## 2011-03-13 LAB — URINE CULTURE
Colony Count: NO GROWTH
Culture: NO GROWTH

## 2011-03-13 LAB — URINALYSIS, ROUTINE W REFLEX MICROSCOPIC
Bilirubin Urine: NEGATIVE
Ketones, ur: NEGATIVE mg/dL
Nitrite: NEGATIVE
Urobilinogen, UA: 1 mg/dL (ref 0.0–1.0)

## 2011-03-13 LAB — COMPREHENSIVE METABOLIC PANEL
Alkaline Phosphatase: 64 U/L (ref 39–117)
BUN: 17 mg/dL (ref 6–23)
Creatinine, Ser: 1.09 mg/dL (ref 0.4–1.5)
Glucose, Bld: 151 mg/dL — ABNORMAL HIGH (ref 70–99)
Potassium: 3.6 mEq/L (ref 3.5–5.1)
Total Bilirubin: 1.5 mg/dL — ABNORMAL HIGH (ref 0.3–1.2)
Total Protein: 6 g/dL (ref 6.0–8.3)

## 2011-03-13 LAB — PROTIME-INR: INR: 1.3 (ref 0.00–1.49)

## 2011-03-13 LAB — APTT: aPTT: 36 seconds (ref 24–37)

## 2011-03-13 LAB — CBC
HCT: 32 % — ABNORMAL LOW (ref 39.0–52.0)
HCT: 35.7 % — ABNORMAL LOW (ref 39.0–52.0)
Hemoglobin: 11 g/dL — ABNORMAL LOW (ref 13.0–17.0)
Hemoglobin: 12.1 g/dL — ABNORMAL LOW (ref 13.0–17.0)
MCHC: 34.4 g/dL (ref 30.0–36.0)
MCV: 84.2 fL (ref 78.0–100.0)
MCV: 84.9 fL (ref 78.0–100.0)
Platelets: 178 10*3/uL (ref 150–400)
RDW: 15.7 % — ABNORMAL HIGH (ref 11.5–15.5)
RDW: 15.9 % — ABNORMAL HIGH (ref 11.5–15.5)

## 2011-03-13 LAB — VITAMIN B12: Vitamin B-12: 249 pg/mL (ref 211–911)

## 2011-03-14 NOTE — Telephone Encounter (Signed)
noted 

## 2011-03-15 ENCOUNTER — Emergency Department: Payer: Self-pay | Admitting: Emergency Medicine

## 2011-03-15 ENCOUNTER — Telehealth: Payer: Self-pay | Admitting: *Deleted

## 2011-03-15 NOTE — Telephone Encounter (Signed)
Noted Doesn't sound too serious

## 2011-03-15 NOTE — Telephone Encounter (Signed)
Nurse at [facility] called to report that pt fell this morning, has a skin tear.  They are sending him to ER for evaluation.

## 2011-03-19 ENCOUNTER — Encounter: Payer: Self-pay | Admitting: Internal Medicine

## 2011-03-19 ENCOUNTER — Ambulatory Visit (INDEPENDENT_AMBULATORY_CARE_PROVIDER_SITE_OTHER): Payer: Medicare Other | Admitting: Internal Medicine

## 2011-03-19 VITALS — BP 100/58 | HR 54 | Temp 97.6°F

## 2011-03-19 DIAGNOSIS — F0280 Dementia in other diseases classified elsewhere without behavioral disturbance: Secondary | ICD-10-CM

## 2011-03-19 DIAGNOSIS — R1319 Other dysphagia: Secondary | ICD-10-CM

## 2011-03-19 DIAGNOSIS — S61409A Unspecified open wound of unspecified hand, initial encounter: Secondary | ICD-10-CM

## 2011-03-19 DIAGNOSIS — S61419A Laceration without foreign body of unspecified hand, initial encounter: Secondary | ICD-10-CM

## 2011-03-19 DIAGNOSIS — R443 Hallucinations, unspecified: Secondary | ICD-10-CM

## 2011-03-19 DIAGNOSIS — I10 Essential (primary) hypertension: Secondary | ICD-10-CM

## 2011-03-19 NOTE — Progress Notes (Signed)
Subjective:    Patient ID: [name] Schwartz, male    DOB: 09-Jul-1914, 77 y.o.   MRN: [id number]  HPI Son and daughter here again [facility] on the 12th Repetitive falls--various times He tries to get out of bed on his own--usually found by his bedside Stitches put in in ER--left wrist  Vision remains poor due to macular degeneration May add to problems as he gets out of bed  Back on pureed diet Couldn't tolerate mech soft Needs assist with eating by staff  Son feels his memory is better off the aricept No apparent hallucinations or psychosis "Fidgety" at times, per staff (daughter notes)  Current outpatient prescriptions:acetaminophen (TYLENOL) 500 MG tablet, Take 500 mg by mouth every 4 (four) hours as needed.  , Disp: , Rfl: ;  aspirin 81 MG tablet, Take 81 mg by mouth daily.  , Disp: , Rfl: ;  atenolol (TENORMIN) 25 MG tablet, Take 25 mg by mouth daily.  , Disp: , Rfl: ;  bisacodyl (DULCOLAX) 10 MG suppository, Place 10 mg rectally as needed.  , Disp: , Rfl:  Ca Carbonate-Mag Hydroxide (MYLANTA ULTRA) 700-300 MG CHEW, Chew by mouth.  , Disp: , Rfl: ;  calcium-vitamin D (OSCAL WITH D 500-200) 500-200 MG-UNIT per tablet, Take 1 tablet by mouth daily.  , Disp: , Rfl: ;  Cholecalciferol (VITAMIN D3) 2000 UNITS TABS, Take by mouth daily.  , Disp: , Rfl: ;  loratadine (CLARITIN) 10 MG tablet, Take 10 mg by mouth daily as needed.  , Disp: , Rfl:  omeprazole (PRILOSEC) 20 MG capsule, Take 20 mg by mouth daily.  , Disp: , Rfl: ;  risperiDONE (RISPERDAL) 0.5 MG tablet, Take 0.5 mg by mouth daily.  , Disp: , Rfl: ;  senna (SENOKOT) 8.6 MG tablet, Take 1 tablet by mouth 2 (two) times daily.  , Disp: , Rfl: ;  traMADol (ULTRAM) 50 MG tablet, Take 50 mg by mouth every 6 (six) hours as needed.  , Disp: , Rfl: ;  Zinc Oxide 25 % PSTE, Apply topically 3 (three) times daily as needed.  , Disp: , Rfl:   Past Medical History  Diagnosis Date  . Diverticulitis   . Hyperlipidemia   .  Hypertension   . Osteoarthritis   . Gout   . History of gastrointestinal hemorrhage 2004    Past Surgical History  Procedure Date  . Appendectomy   . Hernia repair   . Lymph node dissection     right neck  . Hernia repair     right  . Femoral hernia repair 2001  . Hip fracture surgery 10/11    Family History  Problem Relation Age of Onset  . Stroke Neg Hx   . Depression Neg Hx   . Cancer Neg Hx   . Diabetes Brother   . Alzheimer's disease Brother   . Diabetes Sister     History   Social History  . Marital Status: Widowed    Spouse Name: N/A    Number of Children: 2  . Years of Education: N/A   Occupational History  . retired    Social History Main Topics  . Smoking status: Former Games developer  . Smokeless tobacco: Not on file  . Alcohol Use: No  . Drug Use: Not on file  . Sexually Active: Not on file   Other Topics Concern  . Not on file   Social History Narrative   No health care power of attorney, discussed DNR-  daughter requests   Review of Systems Weight seems to be going up Sleeps okay but is up to void---may be the times he is falling    Objective:   Physical Exam  Constitutional: He appears well-developed. No distress.  Neck: Normal range of motion. Neck supple.  Cardiovascular: Normal rate, regular rhythm and normal heart sounds.  Exam reveals no gallop.   No murmur heard.      occ extra beats  Pulmonary/Chest: Effort normal and breath sounds normal. No respiratory distress. He has no wheezes. He has no rales.  Abdominal: Soft. There is no tenderness.  Lymphadenopathy:    He has no cervical adenopathy.  Neurological:       Alert but doesn't really engage No spontaneous speech Doesn't offer any even during exam  Skin:       ~8cm laceration across extensor left thumb to wrist Clean and well apposed No signs of infection          Assessment & Plan:

## 2011-04-11 ENCOUNTER — Emergency Department: Payer: Self-pay | Admitting: Emergency Medicine

## 2011-04-13 ENCOUNTER — Encounter: Payer: Self-pay | Admitting: Internal Medicine

## 2011-04-16 ENCOUNTER — Encounter: Payer: Self-pay | Admitting: Internal Medicine

## 2011-04-16 ENCOUNTER — Ambulatory Visit (INDEPENDENT_AMBULATORY_CARE_PROVIDER_SITE_OTHER): Payer: Medicare Other | Admitting: Internal Medicine

## 2011-04-16 VITALS — BP 100/60 | HR 61 | Temp 97.7°F

## 2011-04-16 DIAGNOSIS — S61219A Laceration without foreign body of unspecified finger without damage to nail, initial encounter: Secondary | ICD-10-CM

## 2011-04-16 DIAGNOSIS — R443 Hallucinations, unspecified: Secondary | ICD-10-CM

## 2011-04-16 DIAGNOSIS — S61209A Unspecified open wound of unspecified finger without damage to nail, initial encounter: Secondary | ICD-10-CM

## 2011-04-16 DIAGNOSIS — F0280 Dementia in other diseases classified elsewhere without behavioral disturbance: Secondary | ICD-10-CM

## 2011-04-16 NOTE — Progress Notes (Signed)
Subjective:    Patient ID: [name] Schwartz, male    DOB: 24-Oct-1914, 77 y.o.   MRN: [id number]  HPI Another fall on May 9th ??fracture of right 4th finger Apparently had sutures put in   No apparent pain  Eating okay  Current outpatient prescriptions:acetaminophen (TYLENOL) 500 MG tablet, Take 500 mg by mouth every 4 (four) hours as needed.  , Disp: , Rfl: ;  aspirin 81 MG tablet, Take 81 mg by mouth daily.  , Disp: , Rfl: ;  atenolol (TENORMIN) 25 MG tablet, Take 25 mg by mouth daily.  , Disp: , Rfl: ;  bisacodyl (DULCOLAX) 10 MG suppository, Place 10 mg rectally as needed.  , Disp: , Rfl:  Ca Carbonate-Mag Hydroxide (MYLANTA ULTRA) 700-300 MG CHEW, Chew by mouth.  , Disp: , Rfl: ;  calcium-vitamin D (OSCAL WITH D 500-200) 500-200 MG-UNIT per tablet, Take 1 tablet by mouth daily.  , Disp: , Rfl: ;  Cholecalciferol (VITAMIN D3) 2000 UNITS TABS, Take by mouth daily.  , Disp: , Rfl: ;  loratadine (CLARITIN) 10 MG tablet, Take 10 mg by mouth daily as needed.  , Disp: , Rfl:  omeprazole (PRILOSEC) 20 MG capsule, Take 20 mg by mouth daily.  , Disp: , Rfl: ;  risperiDONE (RISPERDAL) 0.5 MG tablet, Take 1 by mouth once daily at bedtime, Disp: , Rfl: ;  senna (SENOKOT) 8.6 MG tablet, Take 1 tablet by mouth 2 (two) times daily.  , Disp: , Rfl: ;  traMADol (ULTRAM) 50 MG tablet, Take 50 mg by mouth every 6 (six) hours as needed.  , Disp: , Rfl:  Zinc Oxide 25 % PSTE, Apply topically 3 (three) times daily as needed.  , Disp: , Rfl:   Past Medical History  Diagnosis Date  . Diverticulitis   . Hyperlipidemia   . Hypertension   . Osteoarthritis   . Gout   . History of gastrointestinal hemorrhage 2004  . Alzheimer's dementia     Past Surgical History  Procedure Date  . Appendectomy   . Hernia repair   . Lymph node dissection     right neck  . Hernia repair     right  . Femoral hernia repair 2001  . Hip fracture surgery 10/11    Family History  Problem Relation Age of Onset  . Stroke Neg  Hx   . Depression Neg Hx   . Cancer Neg Hx   . Diabetes Brother   . Alzheimer's disease Brother   . Diabetes Sister     History   Social History  . Marital Status: Widowed    Spouse Name: N/A    Number of Children: 2  . Years of Education: N/A   Occupational History  . retired    Social History Main Topics  . Smoking status: Former Games developer  . Smokeless tobacco: Not on file  . Alcohol Use: No  . Drug Use: Not on file  . Sexually Active: Not on file   Other Topics Concern  . Not on file   Social History Narrative   No health care power of attorney, discussed DNR- daughter requests   Review of Systems No fevers Sleeping okay---a lot during the day per son No sig delusions now    Objective:   Physical Exam  Constitutional: He appears well-developed and well-nourished. No distress.  Musculoskeletal:       Slight tenderness of distal phalanx of right 4th finger  Skin:  Sutures across nail bed on right 4th finger No inflammation  Psychiatric:       Somnolent but arouses to voice Calm and generally cooperative          Assessment & Plan:

## 2011-04-17 NOTE — Discharge Summary (Signed)
NAME:  [name] Schwartz, [name] Schwartz NO.:  [id number]   MEDICAL RECORD NO.:  [id number]          PATIENT TYPE:  INP   LOCATION:  1402                         FACILITY[facility]  [facility]   PHYSICIAN:  Titus Dubin. Hopper, MD,FACP,FCCPDATE OF BIRTH:  08-28-1914   DATE OF ADMISSION:  04/19/2009  DATE OF DISCHARGE:                               DISCHARGE SUMMARY   ADMITTING DIAGNOSES:  1. Altered mental status, probable acute on chronic dementia.  2. Fall.  3. Possible infection.   DISCHARGE DIAGNOSES:  1. Altered mental status, probable acute on chronic dementia.  2. Fall.  3. Possible infection.  4. Anemia with iron deficiency and low normal B12 levels.   For details of history and physical, please see the dictation on Apr 19, 2009, by Dr. Rito Ehrlich.  In brief, apparently the patient has had a fall  in the previous 24 to 48 hours prior to admission.  In the emergency  room, CT scan of the head was normal, as were labs except for a  hematocrit of 35.7.  White count was normal at 9,900 but he did have 86%  neutrophils.  The patient's chief complaint was left knee pain.  The  knee films had revealed mild degenerative changes with chondrocalcinosis  and moderate joint effusion but no fracture.   Chest x-ray revealed chronic lung changes with no acute process.  Specifically, there was mild cardiac enlargement with tortuosity of the  thoracic aorta with calcifications.   The patient was seen by physical therapy and social services.  According  to the daughters, he had a progressive mental status decline in the last  6 months wandering to his neighbor's homes and forgetting medications.  The daughter had requested skilled nursing facility placement.   The patient was pleasantly confused but in no acute distress.  In  reference to the anemia, according to the daughter there has been no  history of abdominal pain, melena, or rectal bleeding.  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   His B12 level was low normal at 249.  His iron levels were low at 18.  He did have a temp max of 99.4 but was asymptomatic.  Chest was clear.  He had a grade 1 systolic murmur.  Abdomen was nontender.  He had no  lymphadenopathy.  Skin was warm and dry.   Followup hemoglobin and hematocrit were 10 and 29.8.  Urine had revealed  no growth.  TSH was therapeutic at 1.60.   The [facility] has [facility].  He is clinically  stable.  No stools had been produced in the hospital to assess for fecal  occult testing.   He did have a creatinine of 1.09 and BUN of 17 and potassium of 3.6.   He was admitted on atenolol 50 mg daily and Tenoretic 50/25 which was  held.  He was also on Prilosec 20 mg daily as needed.  He also was  taking  KCL  10 mEq daily.  He is on aspirin 81 mg daily and  Tylenol  Arthritis as needed.   He will be discharged on atenolol 50 mg and the Tenoretic held and the  potassium stopped.  A One A Day Vitamin with iron would also be  recommended.   DISCHARGE STATUS:  Essentially unchanged.   PROGNOSIS:  Guarded because of his advanced age, dementia, and iron-  deficiency anemia.  If he has a progressive anemia or evidence of GI  bleeding, then acute intervention and evaluation could be pursued but  this should be avoided if possible due to the comorbidities discussed  above.      Titus Dubin. Alwyn Ren, MD,FACP,FCCP  Electronically Signed     [facility]  D:  04/22/2009  T:  04/22/2009  Job:  [id number]   cc:   Arta Silence, MD  Fax: [phone]

## 2011-04-17 NOTE — H&P (Signed)
NAME:  [name] Schwartz, FORGETTE NO.:  [id number]   MEDICAL RECORD NO.:  [id number]          PATIENT TYPE:  INP   LOCATION:  1402                         FACILITY:  [facility]   PHYSICIAN:  Hollice Espy, M.D.DATE OF BIRTH:  20-Sep-1914   DATE OF ADMISSION:  04/19/2009  DATE OF DISCHARGE:                              HISTORY & PHYSICAL   PRIMARY CARE PHYSICIAN:  Arta Silence, MD of [facility].   CHIEF COMPLAINT:  Altered mental status.   HISTORY OF PRESENT ILLNESS:  Please note that when I saw the patient he  had already been transferred from the emergency room up to the hospital  floor.  No family is present.  The patient had received medication for  pain, is sleeping rather heavily, difficult to awaken but he is stable.  I am unable to get any kind of review of systems or history from the  patient only from ER records.  Again family is not present, but  reportedly, the patient is a 89-year white male past medical history of  vertigo, glaucoma and previous GI bleeding who lives alone.  The family  found him at his house with increased confusion.  Apparently, had a fall  in the last 1-2 days.  It was immediately unclear when he was evaluated.  In the emergency room, he is found to have normal CT scan of the head,  normal lab work, noting a normal white count but with an elevated shift.  The patient on point was complaining of left knee pain.  Fore view of  left knee noted some moderate joint effusion, but otherwise  unremarkable.  Because of the concern about the patient's safety at  home, it is felt best he come in for further evaluation and possible  placement.  Again, when I saw the patient, he was somewhat sedated, not  able to give me any kind of history.   PAST MEDICAL HISTORY:  1. Glaucoma.  2. Vertigo.  3. Previous history of GI bleed.  4. Diverticulosis.  5. Gout.  6. Hypertension.  7. Macular degeneration.  We are unable to get the patient  medicines, will have to discuss that  with his PCP.   ALLERGIES:  He has a reported allergy in the computer to set it at  hypnotics and nonbarbiturates but of unknown type and unknown cause.   SOCIAL HISTORY:  Apparently some tobacco, alcohol or drug user.  He  lives at home alone with family nearby.   FAMILY HISTORY:  Noncontributory.   PHYSICAL EXAMINATION:  VITAL SIGNS:  On arrival to the floor,  temperature 98.1, heart rate 86, blood pressure 119/67, respirations 20,  O2 sat 96% on room air.  He has slightly elevated blood pressure in the  emergency room of 153/90.  Also, notably a rectal temperature of 101.2,  although source is not noted.  GENERAL:  Appears to be sleeping comfortably.  HEENT:  Normocephalic atraumatic.  Mucous membranes slightly dry.  No  carotid bruits.  HEART:  Regular rate, S1, S2.  LUNGS:  Clear auscultation bilaterally.  ABDOMEN:  Soft, nontender with positive  bowel sounds.  EXTREMITIES:  No cyanosis, clubbing or edema.   LABORATORY DATA:  Coags are normal.  Urinalysis notes some mucus and 30  of protein, otherwise unremarkable.  Sodium 137, potassium 3.6, chloride  101, bicarb 23, BUN 15, creatinine 1.09, glucose 151.  LFTs noted for  total bili of 1.5 otherwise completely normal.  White count 9.9 but with  an 86% shift.  H&H 12 and 36, MCV of 34, platelet count 179.   ASSESSMENT AND PLAN:  Four view x-ray of the knee found  chondrocalcinosis, mild DJD, moderate joint effusion.  CT scan of the  head notes stable atrophy, no acute abnormalities.  Chest x-ray notes  chronic lung changes, no acute pulmonary findings.   ASSESSMENT/PLAN:  1. Altered mental status may be acute on chronic dementia.  2. Fall.  3. Suspected infection, although source is not clear noting normal      chest x-ray and urinalysis will continue to monitor the patient.      Get PT/OT to see, hydrate and monitor for fever, likely placement.      Hollice Espy, M.D.   Electronically Signed     SKK/MEDQ  D:  04/19/2009  T:  04/20/2009  Job:  [id number]   cc:   Arta Silence, MD  Fax: [phone]

## 2011-04-19 ENCOUNTER — Telehealth: Payer: Self-pay | Admitting: *Deleted

## 2011-04-19 NOTE — Telephone Encounter (Signed)
Oksana at [employer] called to report that pt may have fallen this morning.  He was found sitting on the floor beside of his walker, she thinks he may have tried to walk without it.  He had no injuries, no bruises or torn skin, no pain.  BP 114/62.

## 2011-04-19 NOTE — Telephone Encounter (Signed)
Noted  

## 2011-04-20 NOTE — Op Note (Signed)
NAME:  [name] Schwartz, [name] Schwartz NO.:  [id number]   MEDICAL RECORD NO.:  [id number]                   PATIENT TYPE:  INP   LOCATION:  2102                                 FACILITY:  [facility]   PHYSICIAN:  Lina Sar, M.D. [facility]               DATE OF BIRTH:  Apr 07, 1914   DATE OF PROCEDURE:  DATE OF DISCHARGE:                                 OPERATIVE REPORT   PROCEDURE:  Upper endoscopy.   HISTORY OF PRESENT ILLNESS:  This 77 year old gentleman was admitted through  the emergency room yesterday with hypotension, anemia and passage of dark  stools for 24 hours.  His hemoglobin was 8 g and dropped to 7 g after two  units of packed cells.  He gives history of epigastric pain and taking  Celebrex 200 mg a day as well as aspirin 81 mg a day.  He is now undergoing  upper endoscopy to assess his GI blood loss.   ENDOSCOPE:  Fujinon single-channel video endoscope.   CONSCIOUS SEDATION:  Versed 3 mg IV.   FINDINGS:  Esophagus:  Fujinon single-channel video endoscope passed under  direct vision into the posterior pharynx into the esophagus.  The patient  was monitored by pulse oximetry.  Oxygen saturation was satisfactory at 93-  95% on 2 L of nasal O2.  He had a spasm of the upper esophageal sphincter  which relaxed after gentle pressure on the upper esophageal sphincter.  The  esophageal mucosa appeared normal throughout the proximal and distal  esophagus.  There was a small easily reducible hiatal hernia which measured  about 1-2 cm.  There was no stricture or esophagitis.  Stomach:  The stomach was insufflated with air and showed no evidence of  blood in the stomach.  Gastric folds were normal.  Gastric antrum showed  large inflamed folds with multiple small circular ulcerations, at least four  or five of them were seen on top of the folds and prepyloric antrum.  Some  of them were covered with coffee ground material.  Pyloric outlet was  somewhat spastic but  otherwise normal.  Retroflexion of endoscope revealed  normal fundus and cardia.  Duodenum:  Duodenal bulb showed large clean base. Duodenal ulceration  measuring about 1.5 cm in diameter.  It showed no visible vessel or active  bleeding.  There was no blood in the duodenum.  There was surrounding  erythema and erosions in the duodenal bulb but descending duodenum was  normal.  The endoscope was then brought back into the stomach and biopsies were taken  for CLO-test.   IMPRESSION:  1. Duodenal ulcers without stigmata of active bleeding.  2. Markedly superficial gastric ulcerations, status post CLO-test.   PLAN:  This patient has large lesions in the stomach and duodenum.  We will  continue him in bowel rest for at least 24 hours and continue proton pump  inhibitors.  Celebrex has been discontinued.  __________  carefully every  six hours and he will be transfused to his hemoglobin at baseline.  He will  have to avoid Celebrex and __________  in the future and take some  alternative medication for his gout.  We also await the results of the CLO-  test.                                               Lina Sar, M.D. [facility]   DB/MEDQ  D:  02/06/2003  T:  02/07/2003  Job:  [id number]

## 2011-04-20 NOTE — H&P (Signed)
NAME:  [name] Schwartz, [name] Schwartz NO.:  [id number]   MEDICAL RECORD NO.:  [id number]                   PATIENT TYPE:  INP   LOCATION:  1833                                 FACILITY:  [facility]   PHYSICIAN:  Lina Sar, M.D. [facility]               DATE OF BIRTH:  01-Jan-1914   DATE OF ADMISSION:  02/05/2003  DATE OF DISCHARGE:                                HISTORY & PHYSICAL   CHIEF COMPLAINT:  Loose black stools.   HISTORY OF PRESENT ILLNESS:  Mr. Whiteley is a pleasant 77 year old white  male who is fairly active.  He has no history of coronary artery disease but  does have a history of colon polyps and diverticulosis, as well as  degenerative joint disease.  For the last couple of months, he has been  having some early satiety, and he thinks he has lost some weight, but he is  not sure how much.  In any event, it has not been a significant amount of  weight loss.  He has occasional constipation, and over the past weekend he  did take a laxative and subsequently had normal bowel movements for the next  2-3 days.  His last normal bowel movement was yesterday morning.  Early this  morning, he was awakened with urgency to move his bowels and had some  cramping non-severe abdominal pain.  He proceeded to pass several black  diarrheal stools.  He felt weak, but denies dizziness, diaphoresis, chest  pain, or nausea or vomiting.  He came to the emergency room where hemoglobin  measured 8.0 with MCV within normal limits.  Coags are normal.  BUN is  increased with a normal creatinine.  blood pressures were orthostatic  The  emergency room staff has ordered two units of packed red blood cells to be  transfused.  The patient has no prior history of peptic ulcer disease.  He  has never had upper endoscopy, but has had at least a couple of  colonoscopies because of the history of colon polyps, and his last  colonoscopy was in 7/00, at which time a rectal hyperplastic polyp was  removed, and he was noted to have global pandiverticulosis.  Previous past  colonoscopy had been in 1992 when he had a colon polyp hot biopsied.   ALLERGIES:  Some unknown SLEEPING PILL which has caused agitation and  confusion in the past.   CURRENT MEDICATIONS:  1. Tenoretic 50/20 mg one p.o. q.a.m.  2. Atenolol 50 mg one p.o. b.i.d.  3. Klor-Con 10 mEq one p.o. daily.  4. Meclizine 25 mg one p.o. daily (of note, his daughter says that he     insists on using it every morning, even though it is supposed to be used     q.6h. p.r.n.).  5. Aspirin 81 mg p.o. daily.  6. Celebrex 200 mg p.o. daily.  7. Centrum Silver one p.o. daily.   PAST  MEDICAL HISTORY:  1. Macular degeneration.  2. Hypertension.  3. Inner ear problems with vertigo.  4. Degenerative joint disease.   PAST SURGERIES:  Include bilateral cataract repair, inguinal hernia repair  bilaterally, appendectomy, and removal of some facial skin cancers.   SOCIAL HISTORY:  The patient is a widower for the last five years.  He lives  along in [place], but his daughter lives right next door.  He is able  to walk about a mile several times a week, which takes him about 30 minutes.  He says he walks slowly, but does not have any dyspnea from this.  Alcohol-  wise, he drinks a night cap most nights of the week, which is either 5  ounces of wine or an apricot nectar, which is some sort of liquor from the  liquor store.  He has a history of smoking.  It is a little difficult to  determine the exact pack years, but it sounds like about 40-50, if that, and  he has not smoked for five years.   FAMILY HISTORY:  In his siblings, there is MI, CABG, pacemaker placement,  diabetes mellitus type 2, and question of peptic ulcer disease.  Both of his  parents died in their mid-90s from old age.  There is no history of cancers.   REVIEW OF SYSTEMS:  NEUROLOGIC:  He has decreased visual acuity.  Denies  dizziness, denies headaches,  denies double vision.  ENT:  No nose bleeding,  no sore throat.  PULMONARY:  No cough, no shortness of breath.  No history  of chronic lung disease.  CARDIOVASCULAR:  No palpation, no chest pain, no  edema.  Denies history of any heart troubles or myocardial infarction.  GU:  Does have nocturia on occasion, up to 3-4 episodes a night.  No dysuria, no  hematuria.  GI:  As above.  In addition, he has occasional dysphagia at the  level of the GE junction, and this may be associated with regurgitation.  DERMATOLOGIC:  Did have some facial skin cancer (it sounds like basal cells)  removed in the past.  ENDOCRINE:  No sweats, no polydipsia.  No significant  weight loss.  ID:  No fevers.  All other systems were reviewed and were  negative.   PHYSICAL EXAMINATION:  GENERAL:  The patient is hard of hearing.  He is  alert.  He is an overweight elderly white male who provides a good history.  VITAL SIGNS:  Blood pressure is 139/62, pulse 80 while lying; blood pressure  104/49 with pulse of 68 while sitting.  This was on presentation.  Presently, about an hour later, his blood pressure is 100/44 and pulse of  69.  The exact temperature not recalled, but he was not febrile.  HEENT:  There is some conjunctival pallor.  Extraocular movements are  intact.  Oropharynx - he is wearing dentures.  The mucous membranes are  moist and clear.  Pharynx is nonerythematous.  NECK:  No bruits, no masses, no thyromegaly.  COR:  There is regular rate and rhythm.  No murmurs, rubs, or gallops.  ABDOMEN:  Soft, nondistended, with active bowel sounds.  He is obese.  There  is some slight tenderness in the periumbilical area, as well as right and  left lower quadrants, but no guarding, no rebound.  RECTAL:  There is melenic burgundy type stool which smells somewhat melenic.  No masses.  Prostate is enlarged but smooth.  Rectal tone is within normal  limits. EXTREMITIES:  No clubbing, cyanosis, or edema.  DERMATOLOGIC:   No spider veins.  No significant purpura or hematoma.  No  jaundice.   LABORATORY DATA:  Hemoglobin is 8, hematocrit 22.9, platelets 276,000, white  blood cell count 11.4, and MCV is 92.5.  Sodium 137, potassium 5.1, BUN 42,  creatinine 1.2, glucose 164.  Total bilateral 0.5, alkaline phosphatase 47,  AST 17, ALT 13.  Albumin is 2.7.  PT is 16.6, INR 1.3, and PTT of 29.   IMPRESSION:  1. Gastrointestinal bleed.  Questionable whether this is a lower     gastrointestinal versus an upper gastrointestinal source.  With his     increased BUN and the fact that he is on daily aspirin and Celebrex, it     is possible this is an upper GI bleed from ulcers or gastritis.  However,     he also has a history of global diverticulosis, so a diverticular bleed     is also possible, as is the possibility that this is from a stercoral     ulcer, given his recent constipation.  However, he says that his bowel     movements after taking a laxative were nonbloody, which counts against a     stercoral ulcers.  2. Anemia associated with a gastrointestinal bleed.  Unclear as to what his     baseline hemoglobin is, but in any event, he was anemic and symptomatic     from this, as well as possibly from hypovolemia, so he needs transfusions     and IV fluids.  3. Hypotension owing to gastrointestinal bleed with probable hypovolemia.  4. Azotemia.  This may very well be from gastrointestinal bleed, but also     will rule out dehydration.  5. Occasional dysphagia.  Need to rule out esophageal stricture, but with     his age it is very likely he has presbyesophagus.  6. Degenerative joint disease, for which he is on chronic Celebrex.  7. Controlled hypertension.  8. Hyperglycemia.  The patient does not have a history of diabetes mellitus     so far as we know.   PLANS:  1. He is to be admitted to a step down bed.  2. Plan to transfuse the two units of packed red blood cells that were     ordered and continue IV  fluids and obtain serial hemoglobins and     hematocrits.  3. The patient will need upper endoscopy, and question whether that should     be performed later today or sometime tomorrow.  If the EGD is negative as     to bleeding source, he will most likely need a colonoscopy.  4. The patient will get a couple of doses of IV Protonix, and then will     switch over to oral dosing thereafter.  5. Check a urinalysis, because he does have some urinary symptoms, and he is     a little bit tender in the lower abdomen.  6. There was some question as to get a KUB, but at this point it does not     seem indicated.  If he develops significant pain, a CT scan would be the     more appropriate study in order to rule out possible ischemia, though the     level of bleeding seems beyond ischemic, and the low level of tenderness     and pain does not seem consistent with ischemia.    Huntley Dec  Kandace Blitz. [facility]                    Lina Sar, M.D. [facility]    SG/MEDQ  D:  02/05/2003  T:  02/06/2003  Job:  [id number]

## 2011-04-20 NOTE — Discharge Summary (Signed)
NAME:  [name] Schwartz, [name] Schwartz                       ACCOUNT NO.:  [id number]   MEDICAL RECORD NO.:  [id number]                   PATIENT TYPE:  INP   LOCATION:  5735                                 FACILITY:  [facility]   PHYSICIAN:  Barbette Hair. Arlyce Dice, M.D. [facility]          DATE OF BIRTH:  Mar 31, 1914   DATE OF ADMISSION:  02/05/2003  DATE OF DISCHARGE:  02/08/2003                                 DISCHARGE SUMMARY   DATE OF BIRTH:  1914-08-10   ADMITTING DIAGNOSES:  1. Gastrointestinal hemorrhage.  Rule out lower versus upper     gastrointestinal source.  Patient on daily aspirin and Celebrex and does     have increased BUN all pointing towards an upper gastrointestinal source.     Also has history of diverticulosis, and possibly recent constipation     leading to differential of diverticular bleed and/or stercoral ulcer as     the source of the bleed.  2. Symptomatic anemia associated with gastrointestinal bleed.  3. Hypotension associated with gastrointestinal bleed and probably     hypovolemia.  4. Azotemia, creatinine within normal limits, but BUN elevated which may     very possibly be from combination of blood in the gastrointestinal tract     as well as hypovolemia.  5. Occasional dysphagia, rule out esophageal stricture, rule out     presbyesophagus.  6. Degenerative joint disease, treated with chronic Celebrex.  7. Controlled hypertension.  8. Hyperglycemia. Patient has no history of type 2 diabetes mellitus.  9. Macular degeneration.  10.      Inner ear problems with vertigo.  11.      Status post bilateral cataract repair.  12.      Status post inguinal hernia repair bilaterally.  13.      Status post appendectomy.  14.      Status post removal of facial skin cancers.  15.      History of gout.  16.      History of polyps and diverticulosis.   DISCHARGE DIAGNOSES:  1. Anemia secondary to gastrointestinal bleed.  Hemoglobin and hematocrit     stable following initial  transfusions, total transfusions with 4 units of     packed red blood cells.  2. Duodenal ulcer, which was a source for the gastrointestinal bleed. He     also has ulcerative enteritis and was CLO negative.  3. History of hypertension with hypertension well within normal limits     during this hospital admission.  4. Azotemia, resolved.  5. Hyperglycemia, isolated.  6. Degenerative joint disease. Celebrex discontinued during this admission     and patient having symptoms of foot pain as a result.   CONSULTATIONS:  None.   PROCEDURES:  Upper endoscopy by Dr. Lina Sar on February 06, 2003.  This  showed a non bleeding duodenal ulcer with no stigmata of recent bleed.  There were multiple superficial gastric ulcers in the  antral region.  There  were some coffee-grounds material in the antrum but no fresh blood.  CLO  biopsy obtained and was negative for Helicobacter pylori.   BRIEF HISTORY:  The patient is a pleasant 77 year old white male who has  above-noted past medical history.  He came to the emergency room on the 5th  of August complaining of black diarrheal stools which had started on the  morning of admission.  This was associated with weakness but no other  constitutional symptoms.  He did report a several week history of early  satiety.  A few days prior to presenting to the emergency room he had  treated himself with a laxative for some constipation and had normal bowel  movements over the subsequent 2 to 3 days. On arrival to the emergency room  coagulation studies were normal, hemoglobin was 8.  Blood pressures were  orthostatic.  No prior history of peptic ulcer disease, but had been using  Celebrex as well as 81 mg of aspirin daily.  He was evaluated and admitted  by Dr. Lina Sar for GI bleed.  On rectal examination, stool was burgundy  and appeared somewhat melenic, though it possibly could have been lower GI  source as well.  The plan was to stabilize the patient in the  next several  hours and to perform upper endoscopy the following day.   LABORATORY DATA:  Hemoglobin nadir of 7.9, it measured 10.8 at discharge.  MCV was 92.5.  Hematocrit went from 22.9 on admission to 30.4 at discharge.  Platelets ranged 276 down to 146 thousand.  White blood cell count went from  11.4 to 6.1.  On differential, there was a left shift.  PT 15.6, INR 1.3,  PTT of 39.  Sodium 137, potassium 5.1.  Glucose 164, normalized to 105.  BUN  went from 42 to 12.  Creatinine was 1.2.  Albumin was 2.7.  Total bilirubin  0.5, alkaline phosphatase 47, AST 17, ALT 13.  BNP 45.9.  Urinalysis was  negative.  CLO testing was negative.  She had a single view chest x-ray that  showed cardiomegaly, no acute disease, but the thoracic aorta was tortuous  and atherosclerotic.   HOSPITAL COURSE:  The patient was admitted initially to 2100, the medical  ICU for close observation given his age and orthostatic symptoms along with  the GI bleed.  Over the next 16 hours his black stools resolved.  He had no  nausea or vomiting.  Blood pressure and pulse were stable with systolics  anywhere from 110 up to the 140s.  Pulse is not tachycardic.  His blood  pressure medications were held.  He had been started on IV Protonix but then  changed over to oral Protonix.  Initially, he received 2 units of packed red  blood cells within the first 12 hours and between hospital day 2 and 3 he  received another 2 units of packed red blood cells.   The patient underwent upper endoscopy by Dr. Lina Sar.  Findings are  described above.  He tolerated the endoscopy well and remained stable for  the next couple of days having no further stools whatsoever.  His diet was  advanced from clear liquids to a low residue diet, which he tolerated.   The patient's Celebrex and aspirin were held.  With the holding of the  Celebrex, especially, the patient developed pain in his feet.  He says that he has a history of gout and  although  the pain was generalized in his feet,  possibly this could be gout flare, though there is not any erythema or heat  associated with his complaints.  He is to use Tylenol for pain control in  the future.  Possibly if the pain persist despite Tylenol therapy, he may  need narcotic analgesics, but this will be left up to his primary care  physician.   Throughout hospitalization, the patient's blood pressure and pulse were  stable and he did not require restarting of his antihypertensive.  These are  to be restarted when he leaves the hospital.  He was warned that if he  developed dizziness, that possibly his blood pressure might be getting too  low and that he should check with Dr. Hetty Ely. He was also advised if he  had persistent black or maroon stools that he should contact the office.  One caveat is that he will be taking a single dose of iron daily for the  next couple of months.  This may cause some darkening of the stools but  should not cause diarrhea.   For acid suppression, the patient is to use over-the-counter Prilosec as  this will be the most economical proton pump inhibitor therapy available to  this senior citizen without any prescription medication program available to  him.  He has a return appointment to see Dr. Juanda Chance in the office on April 6  at 4 p.m. and he should contact Dr. Hetty Ely if he has any problems in the  meantime.    DISCHARGE MEDICATIONS:  1. Tenoretic 50 mg 1 p.o. q.a.m.  2. Atenolol 50 mg 1 p.o. b.i.d.  3. Centrum Silver 1 p.o. daily.  4. Meclizine 25 mg p.o. daily p.r.n. dizziness.  5. Iron sulfate 325 mg 1 p.o. daily.  6. Prilosec 20 mg 1 p.o. daily.  7. No Celebrex.  8. Aspirin at 81 mg p.o. daily can be restarted on March 27th.        Brett Canales, P.A. [facility]                    Robert D. Arlyce Dice, M.D. [facility]    SG/MEDQ  D:  02/08/2003  T:  02/08/2003  Job:  [id number]

## 2011-04-20 NOTE — Op Note (Signed)
[place]  Patient:    [name] Schwartz, [name] Schwartz                    MRN: [id number] Proc. Date: 07/03/00 Adm. Date:  [id number] Disc. Date: [id number] Attending:  Charlton Haws CC:         Tera Mater. Clent Ridges, M.D.                           Operative Report  PREOPERATIVE DIAGNOSIS:  Right inguinal hernia.  POSTOPERATIVE DIAGNOSIS:  Right inguinal hernia - indirect.  OPERATION:  Repair of right inguinal hernia.  SURGEON:  Currie Paris, M.D.  ANESTHESIA:  MAC.  CLINICAL HISTORY:  This patient is an 77 year old who presents with a symptomatic right inguinal hernia that he desired to go ahead and have fixed.  DESCRIPTION OF PROCEDURE:  The patient was brought to the operating room. After satisfactory IV sedation, the groin area was prepped and draped.  A combination 1% Xylocaine mixed equally with 0.5% Marcaine with epinephrine was used for local.  I infiltrated the incision line plus below the anterior superior iliac spine area.  The incision was made deep in the external oblique aponeurosis with additional local infiltrated as needed.  The aponeurosis was opened in the line of its fibers into the superficial ring.  The cord structures lifted up off the floor and surrounded the Penrose drain.  The floor was thinned out, but his main defect was a large indirect sac which had dilated the indirect ring significantly.  I was able to clean the sac off of the cord structures and open it.  I stripped it down to the deep ring and then occluded it, twisted it, ligated it with a 2-0 silk plus a 2-0 silk tie, and then amputated it.  It retracted nicely under the deep ring.  Everything appeared to be dry at this point.  I put a small mesh plug into the deep ring and secured it with a 2-0 Prolene tied laterally to occlude it and hold it in place.  The mesh patch was then sutured in with a running suture inferiorly starting overlapping the pubic tubercle  and starting at that aspect and running laterally.  Medially, it was tacked down to the internal oblique. It had already been split laterally, and the tails were crossed and tacked together and laid down well lateral to the ring to reinforce the lateral aspect of the floor.  The external oblique was closed with a running 3-0 Vicryl, Scarpas with 3-0 Vicryl, and the skin with 4-0 Monocryl subcuticular plus Steri-Strips.  The patient tolerated the procedure well.  There are no operative complications. All counts were correct. DD:  07/03/00 TD:  07/04/00 Job: [id number]

## 2011-04-24 ENCOUNTER — Encounter: Payer: Self-pay | Admitting: Internal Medicine

## 2011-04-25 ENCOUNTER — Other Ambulatory Visit: Payer: Self-pay | Admitting: *Deleted

## 2011-04-25 MED ORDER — SENNOSIDES 8.6 MG PO TABS: 1.0000 | ORAL_TABLET | Freq: Two times a day (BID) | ORAL | Status: AC

## 2011-05-01 ENCOUNTER — Other Ambulatory Visit: Payer: Self-pay | Admitting: *Deleted

## 2011-05-01 MED ORDER — ZINC OXIDE 25 % EX PSTE: PASTE | CUTANEOUS | Status: AC

## 2011-06-26 ENCOUNTER — Telehealth: Payer: Self-pay | Admitting: *Deleted

## 2011-06-26 ENCOUNTER — Encounter: Payer: Self-pay | Admitting: *Deleted

## 2011-06-26 NOTE — Telephone Encounter (Signed)
.  left message to have patient's daughter return my call. Calling daughter to ask about FL-2 form she dropped off to have Dr.Letvak sign.

## 2011-06-27 NOTE — Telephone Encounter (Signed)
Signed and returned to [place].

## 2011-06-27 NOTE — Telephone Encounter (Signed)
Daughter called back and stated they need the FL-2 form to get help for the patient, she didn't say what kind of help. I also advised her that the FL-2 form she bought in is different than the one scanned into his chart in centricity, she stated that [facility] filled it out. Per daughter if she has to wait then she will until Dr.Letvak comes back, I advised that I would call [facility] and talk with someone about this.  Spoke with Leotis Shames, RN [phone]) and she states all of the patient's medications are up to date, pt is applying for medicaid and need the FL-2 by Thursday. I have updated patient's medication list to match FL-2 form. Will forward to Dr.Aron for signature.

## 2011-07-03 ENCOUNTER — Telehealth: Payer: Self-pay | Admitting: *Deleted

## 2011-07-03 NOTE — Telephone Encounter (Signed)
Patient is applying for a duplicate social security card and needs his medical records signed by his doctor. Patient has a copy of his last office notes, but they are requiring that they be signed by his MD. Explained to patient that Dr. Alphonsus Sias is out of the office until Monday.  Patient's son is wondering if another MD would sign them since Dr. Alphonsus Sias is out.

## 2011-07-03 NOTE — Telephone Encounter (Signed)
Spoke with Dr.Duncan and he signed and pt's daughter is aware and she will pickup at the front desk.

## 2011-07-16 ENCOUNTER — Telehealth: Payer: Self-pay | Admitting: *Deleted

## 2011-07-16 NOTE — Telephone Encounter (Signed)
This sounds pretty vague but given possible hematuria, will send urine

## 2011-07-16 NOTE — Telephone Encounter (Signed)
[name] Schwartz says that the patient fell on Saturday but did not appear to be injured.  This morning, they found spots of blood in his Depends, not sure if it was urine related or hemorrhoids.  They have been closely monitoring him and have not decided yet if he needs to be sent out for evaluation.  He is lethargic and will not try to walk or stand.  They would like to get an order for a urinalysis faxed to them at [facility]:  [phone].

## 2011-07-17 NOTE — Telephone Encounter (Signed)
Order faxed.

## 2011-07-23 ENCOUNTER — Ambulatory Visit: Payer: Medicare Other | Admitting: Internal Medicine

## 2011-07-25 ENCOUNTER — Telehealth: Payer: Self-pay | Admitting: *Deleted

## 2011-07-25 NOTE — Telephone Encounter (Signed)
noted 

## 2011-07-25 NOTE — Telephone Encounter (Signed)
[name] Schwartz called to report that pt was found walking this morning and he fell on his bottom.  He was not injured and vitals are normal.

## 2011-07-30 ENCOUNTER — Telehealth: Payer: Self-pay | Admitting: *Deleted

## 2011-07-30 NOTE — Telephone Encounter (Signed)
noted 

## 2011-07-30 NOTE — Telephone Encounter (Signed)
[facility] Triage Call Report Triage Record Num: [id number] Operator: Patriciaann Clan Patient Name: [name] Schwartz Call Date & Time: 07/27/2011 5:14:03PM Patient Phone: [phone] PCP: Tillman Abide Patient Gender: Male PCP Fax : [phone] Patient DOB: 1914/06/07 Practice Name: [facility] Reason for Call: Leotis Shames RN calling from [facility] @[phone] . States patietn was ordered Cipro 500mg  BID 08/19/11 for dx of UTI. States she received Sensitiviy results that bacteria is resistent to cipro. States Sensitivity results: >1000,000 of Ecoli. Sensitive to Imipenem, Cefazolin, Cefoxitin, Ceftriaxone, Ceftazidime, Cefetime, Amikacin, Gentamycin, Tobramycin, Nitrofurntoin, Triameth/Sulfa. Patient c/o urinary frequency and urinary urgency. Onset 07/16/11. Afebrile. States patient refuses to have current VS taken. Triage per Urinary sx Protocol. No emergent sx identified. Orders given per Practice Profile standing orders: Septra DS; 1 p.o. TIDX 10 days. Lauren RN advised of above. Care advice given per guidelines. Call back parameters reviewed. Advised to call office 07/30/11. Lauren RN verbalizes understanding and agreeable. Above order Septra faxed to [phone]. Protocol(s) Used: Urinary Symptoms - Male Recommended Outcome per Protocol: See Provider within 24 hours Reason for Outcome: Evaluated by provider AND symptoms worsening after following recommended treatment plan for 72 hours Care Advice: ~ Call provider if you develop flank or low back pain, fever, chills, or generally feel sick. Increase intake of fluids. Try to drink 8 oz. (.2 liter) every hour when awake, including unsweetened cranberry juice, unless on restricted fluids for other medical reasons. Take sips of fluid or eat ice chips if nauseated or vomiting. ~ ~ SYMPTOM / CONDITION MANAGEMENT ~ Call provider if urine is pink, red, smoky or cola colored. 07/27/2011 5:46:26PM Page 1 of 1  [email or web address]

## 2011-08-13 ENCOUNTER — Ambulatory Visit: Payer: Medicare Other | Admitting: Internal Medicine

## 2011-08-17 ENCOUNTER — Ambulatory Visit: Payer: Medicare Other | Admitting: Internal Medicine

## 2011-08-29 ENCOUNTER — Telehealth: Payer: Self-pay | Admitting: Internal Medicine

## 2011-08-29 ENCOUNTER — Other Ambulatory Visit: Payer: Self-pay | Admitting: *Deleted

## 2011-08-29 ENCOUNTER — Ambulatory Visit: Payer: Medicare Other | Admitting: Internal Medicine

## 2011-08-29 MED ORDER — ACETAMINOPHEN 500 MG PO CAPS: 1.0000 | ORAL_CAPSULE | ORAL | Status: AC | PRN

## 2011-08-29 NOTE — Telephone Encounter (Signed)
[name] Schwartz called to let Dr. Alphonsus Sias know that they are switching to the doctor at [facility] for [name] Schwartz.  She said that it is too hard for him to get around and for transportation to make it to the office and that they have appreciated having Dr. Alphonsus Sias as his doctor.  Call back is [phone]

## 2011-08-29 NOTE — Telephone Encounter (Signed)
Discussed with her Will get care from [facility] who sends a NP there This is fine Wished them all well

## 2011-08-31 ENCOUNTER — Emergency Department: Payer: Self-pay | Admitting: Emergency Medicine

## 2011-09-25 ENCOUNTER — Other Ambulatory Visit: Payer: Self-pay | Admitting: *Deleted

## 2011-09-25 MED ORDER — OMEPRAZOLE 20 MG PO CPDR: 20.0000 mg | DELAYED_RELEASE_CAPSULE | Freq: Every day | ORAL | Status: AC

## 2011-10-01 ENCOUNTER — Telehealth: Payer: Self-pay | Admitting: Internal Medicine

## 2011-10-01 NOTE — Telephone Encounter (Signed)
Spoke with nurse Renaye Rakers and advised results

## 2011-10-01 NOTE — Telephone Encounter (Signed)
Please call her Family member told us that they were going to use the [facility] nurse practitioner that comes there

## 2011-10-01 NOTE — Telephone Encounter (Signed)
Renaye Rakers from [facility] called to notify of patient's increased cough withmeals and signs of aspiration.  She is requesting Speech Therapy for dysphagia and OT for increasing ability to function and quality of life.  She can be called back at [phone].

## 2012-01-17 DIAGNOSIS — F0280 Dementia in other diseases classified elsewhere without behavioral disturbance: Secondary | ICD-10-CM

## 2012-01-17 DIAGNOSIS — R1310 Dysphagia, unspecified: Secondary | ICD-10-CM

## 2012-01-17 DIAGNOSIS — Z5189 Encounter for other specified aftercare: Secondary | ICD-10-CM

## 2012-01-17 DIAGNOSIS — G309 Alzheimer's disease, unspecified: Secondary | ICD-10-CM

## 2012-01-17 DIAGNOSIS — F0281 Dementia in other diseases classified elsewhere with behavioral disturbance: Secondary | ICD-10-CM

## 2012-01-25 ENCOUNTER — Other Ambulatory Visit: Payer: Self-pay

## 2012-01-25 MED ORDER — VITAMIN D3 50 MCG (2000 UT) PO TABS: 2000.0000 [IU] | ORAL_TABLET | Freq: Every day | ORAL | Status: AC

## 2012-01-25 NOTE — Telephone Encounter (Signed)
Received fax refill request from patient's pharmacy.  Prescription refilled. 

## 2012-02-01 ENCOUNTER — Other Ambulatory Visit: Payer: Self-pay | Admitting: *Deleted

## 2012-02-01 MED ORDER — ATENOLOL 25 MG PO TABS: 25.0000 mg | ORAL_TABLET | Freq: Every day | ORAL | Status: AC

## 2012-02-01 MED ORDER — ASPIRIN 81 MG PO TABS: 81.0000 mg | ORAL_TABLET | Freq: Every day | ORAL | Status: AC

## 2012-03-03 ENCOUNTER — Other Ambulatory Visit: Payer: Self-pay | Admitting: *Deleted

## 2012-03-03 MED ORDER — OYSTER SHELL CALCIUM/D 500-200 MG-UNIT PO TABS: 1.0000 | ORAL_TABLET | Freq: Four times a day (QID) | ORAL | Status: AC

## 2012-03-16 ENCOUNTER — Observation Stay: Payer: Self-pay | Admitting: Internal Medicine

## 2012-03-16 LAB — COMPREHENSIVE METABOLIC PANEL
Albumin: 3.1 g/dL — ABNORMAL LOW (ref 3.4–5.0)
Alkaline Phosphatase: 80 U/L (ref 50–136)
Anion Gap: 10 (ref 7–16)
Bilirubin,Total: 0.8 mg/dL (ref 0.2–1.0)
Calcium, Total: 8.4 mg/dL — ABNORMAL LOW (ref 8.5–10.1)
Co2: 22 mmol/L (ref 21–32)
Creatinine: 0.88 mg/dL (ref 0.60–1.30)
EGFR (African American): >60
EGFR (Non-African Amer.): >60
Glucose: 113 mg/dL — ABNORMAL HIGH (ref 65–99)
Potassium: 4.8 mmol/L (ref 3.5–5.1)
SGPT (ALT): 14 U/L
Sodium: 135 mmol/L — ABNORMAL LOW (ref 136–145)

## 2012-03-16 LAB — URINALYSIS, COMPLETE
Bacteria: NONE SEEN
Hyaline Cast: 2
Leukocyte Esterase: NEGATIVE
Squamous Epithelial: NONE SEEN
WBC UR: 1 /HPF (ref 0–5)

## 2012-03-16 LAB — TROPONIN I: Troponin-I: <0.02 ng/mL

## 2012-03-16 LAB — CBC
MCH: 27.9 pg (ref 26.0–34.0)
MCHC: 32.9 g/dL (ref 32.0–36.0)
MCV: 85 fL (ref 80–100)
Platelet: 194 10*3/uL (ref 150–440)
RBC: 4.09 10*6/uL — ABNORMAL LOW (ref 4.40–5.90)
RDW: 17.2 % — ABNORMAL HIGH (ref 11.5–14.5)
WBC: 7.5 10*3/uL (ref 3.8–10.6)

## 2012-03-17 DIAGNOSIS — I498 Other specified cardiac arrhythmias: Secondary | ICD-10-CM

## 2012-03-17 DIAGNOSIS — R55 Syncope and collapse: Secondary | ICD-10-CM

## 2012-03-17 LAB — TROPONIN I
Troponin-I: <0.02 ng/mL
Troponin-I: <0.02 ng/mL

## 2012-03-17 LAB — HEMOGLOBIN: HGB: 9.8 g/dL — ABNORMAL LOW (ref 13.0–18.0)

## 2012-03-27 ENCOUNTER — Encounter: Payer: Self-pay | Admitting: *Deleted

## 2012-04-03 ENCOUNTER — Encounter: Payer: Medicare Other | Admitting: Cardiovascular Disease

## 2012-06-05 ENCOUNTER — Emergency Department: Payer: Self-pay | Admitting: Emergency Medicine

## 2012-06-05 LAB — URINALYSIS, COMPLETE
Blood: NEGATIVE
Ketone: NEGATIVE
Leukocyte Esterase: NEGATIVE
Ph: 9 (ref 4.5–8.0)
Protein: NEGATIVE
RBC,UR: <1 /HPF (ref 0–5)

## 2012-08-03 ENCOUNTER — Inpatient Hospital Stay: Payer: Self-pay | Admitting: Internal Medicine

## 2012-08-03 LAB — COMPREHENSIVE METABOLIC PANEL WITH GFR
Albumin: 3 g/dL — ABNORMAL LOW
Alkaline Phosphatase: 86 U/L
Anion Gap: 3 — ABNORMAL LOW
BUN: 18 mg/dL
Bilirubin,Total: 0.6 mg/dL
Calcium, Total: 8.4 mg/dL — ABNORMAL LOW
Chloride: 105 mmol/L
Co2: 30 mmol/L
Creatinine: 1.12 mg/dL
EGFR (African American): >60
EGFR (Non-African Amer.): 56 — ABNORMAL LOW
Glucose: 92 mg/dL
Osmolality: 277
Potassium: 3.9 mmol/L
SGOT(AST): 20 U/L
SGPT (ALT): 12 U/L
Sodium: 138 mmol/L
Total Protein: 6 g/dL — ABNORMAL LOW

## 2012-08-03 LAB — URINALYSIS, COMPLETE
Nitrite: NEGATIVE
Protein: NEGATIVE
RBC,UR: 1 /HPF (ref 0–5)
WBC UR: <1 /HPF (ref 0–5)

## 2012-08-03 LAB — PRO B NATRIURETIC PEPTIDE: B-Type Natriuretic Peptide: 1597 pg/mL — ABNORMAL HIGH

## 2012-08-03 LAB — TROPONIN I: Troponin-I: 0.03 ng/mL

## 2012-08-03 LAB — CBC
HCT: 34.6 % — ABNORMAL LOW (ref 40.0–52.0)
HGB: 11.9 g/dL — ABNORMAL LOW (ref 13.0–18.0)
MCH: 29.1 pg (ref 26.0–34.0)
MCV: 85 fL (ref 80–100)
RBC: 4.09 10*6/uL — ABNORMAL LOW (ref 4.40–5.90)
WBC: 6.1 10*3/uL (ref 3.8–10.6)

## 2012-08-04 LAB — CBC WITH DIFFERENTIAL/PLATELET
Basophil #: 0 10*3/uL (ref 0.0–0.1)
Basophil %: 0.3 %
Eosinophil #: 0.2 10*3/uL (ref 0.0–0.7)
Lymphocyte %: 11.8 %
MCHC: 34.4 g/dL (ref 32.0–36.0)
Neutrophil %: 74 %
RDW: 15.9 % — ABNORMAL HIGH (ref 11.5–14.5)
WBC: 7 10*3/uL (ref 3.8–10.6)

## 2012-08-05 LAB — CBC WITH DIFFERENTIAL/PLATELET
Basophil %: 0.8 %
Eosinophil #: 0.2 10*3/uL (ref 0.0–0.7)
HCT: 33.7 % — ABNORMAL LOW (ref 40.0–52.0)
Lymphocyte #: 1 10*3/uL (ref 1.0–3.6)
Lymphocyte %: 14.3 %
MCHC: 35.3 g/dL (ref 32.0–36.0)
MCV: 83 fL (ref 80–100)
Neutrophil #: 5.1 10*3/uL (ref 1.4–6.5)
RDW: 15.5 % — ABNORMAL HIGH (ref 11.5–14.5)

## 2012-08-05 LAB — BASIC METABOLIC PANEL
BUN: 19 mg/dL — ABNORMAL HIGH (ref 7–18)
Chloride: 103 mmol/L (ref 98–107)
EGFR (African American): >60
Osmolality: 275 (ref 275–301)
Potassium: 3.8 mmol/L (ref 3.5–5.1)

## 2012-08-06 LAB — OCCULT BLOOD X 1 CARD TO LAB, STOOL: Occult Blood, Feces: NEGATIVE

## 2012-08-07 LAB — CULTURE, BLOOD (SINGLE)

## 2012-08-15 IMAGING — CR DG CHEST 1V PORT
1 series · 1 of 1 positions shown · non-contrast
Comparison: none

REASON FOR EXAM: tachypnic
COMMENTS:

[view not recorded]
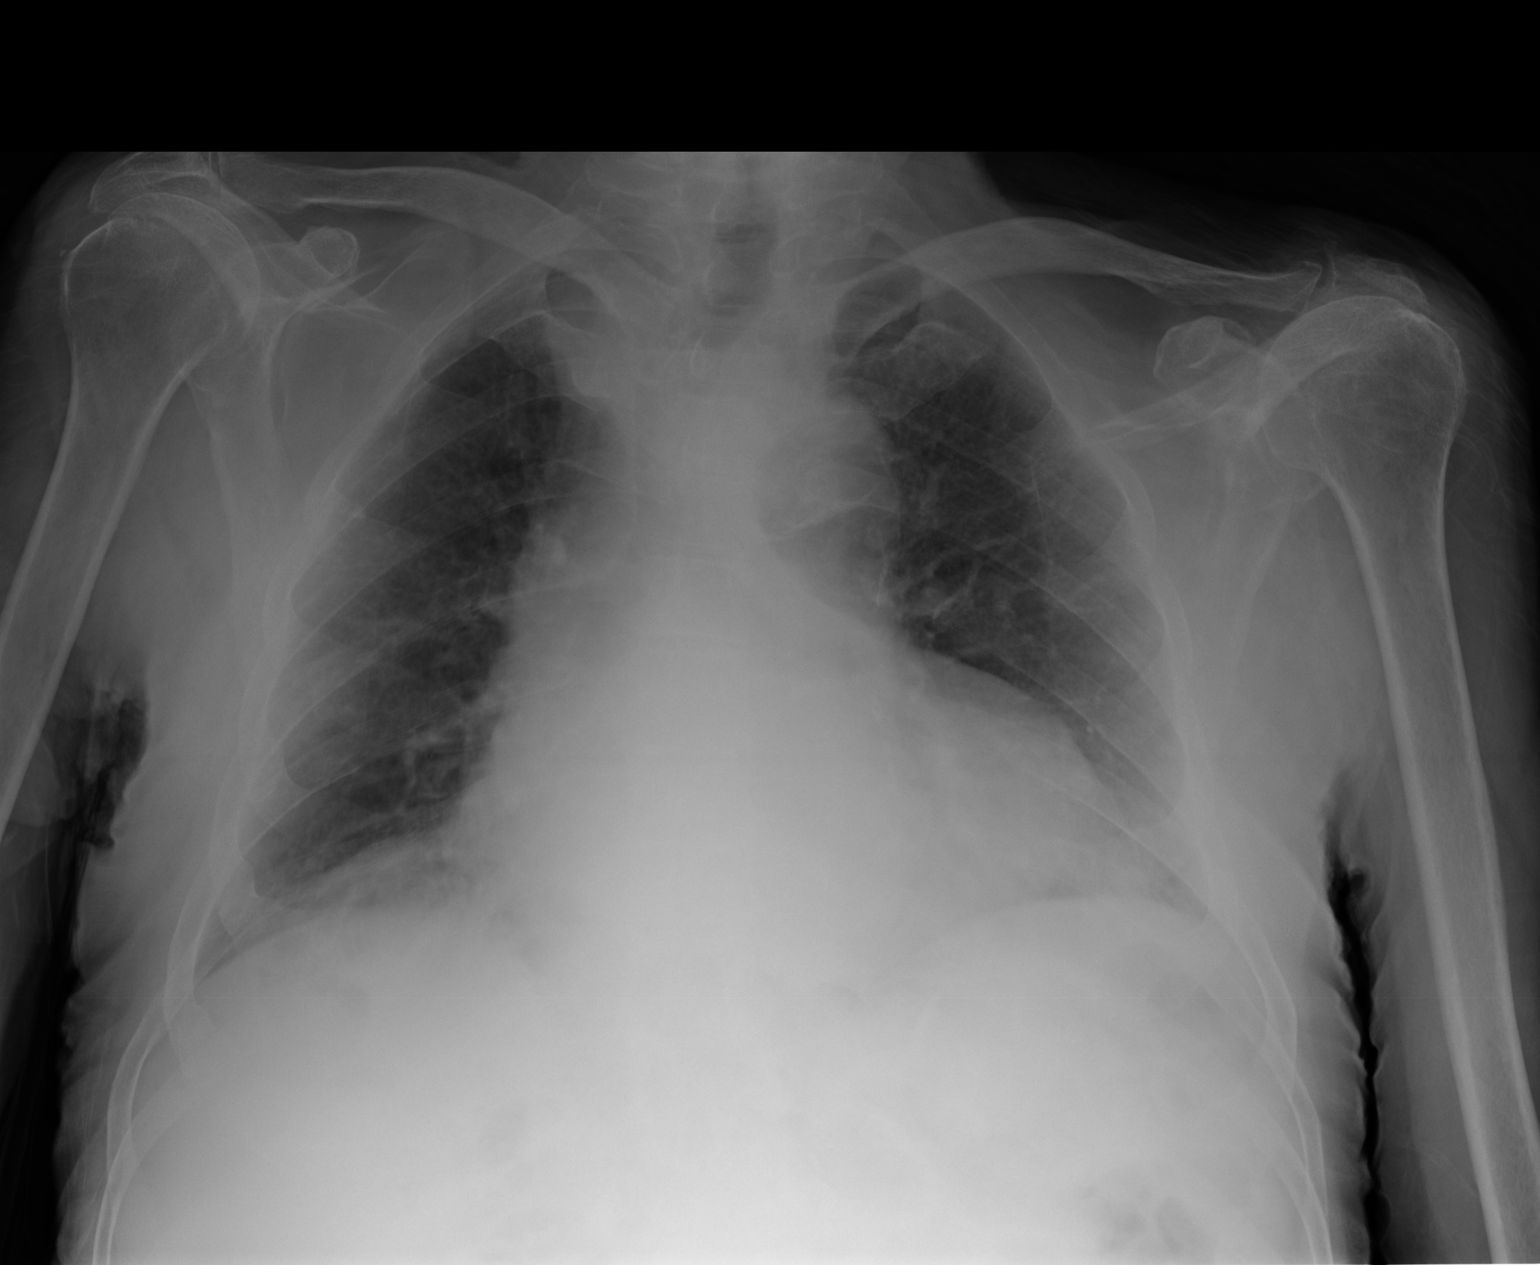

[1 of 1 positions shown; findings below may reference images not displayed]

PROCEDURE:     DXR - DXR PORTABLE CHEST SINGLE VIEW  - January 15, 2011  [DATE]

RESULT:     Comparison is made to the study 27 September, 2010.

The lungs are adequately inflated. The cardiac silhouette is mildly
enlarged. There is tortuosity of the descending thoracic aorta. There is no
pleural effusion. Minimally increased density at both lung bases may reflect
subsegmental atelectasis. There are degenerative changes of the shoulders.
IMPRESSION: The findings suggest low-grade CHF. Minimally increased
density just above the hemidiaphragms may reflect atelectasis or early
infiltrate or edema. A followup PA and lateral chest x-ray would be of value.

## 2012-08-15 IMAGING — CT CT CERVICAL SPINE WITHOUT CONTRAST
1 series · 12 of 14 positions shown, 15 images · non-contrast
Comparison: none

REASON FOR EXAM: s/p fall
COMMENTS:

PROCEDURE:     CT  - CT CERVICAL SPINE WO  - January 15, 2011 [DATE]
RESULT:     CT cervical spine dated 01/15/2011.
TECHNIQUE: Multiplanar noncontrasted imaging cervical spine was obtained
utilizing 2 mm helical acquisition and bone reconstruction algorithm.

[Series 5: axial · axial · 0.33mm/px · z∈[+270,+407]mm · 12 of 83 slices shown, 15 images]
[im 7/83  soft-tissue]
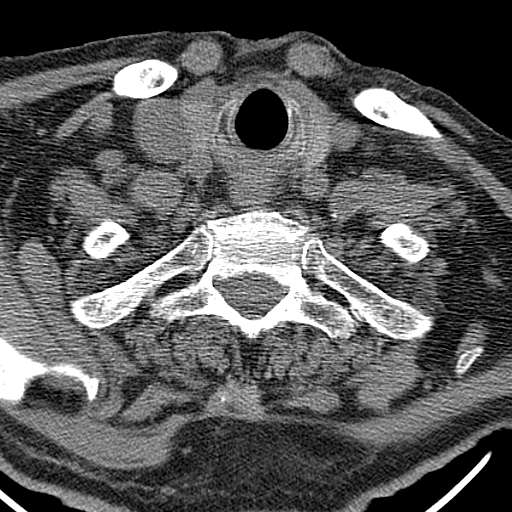
[im 7/83  bone]
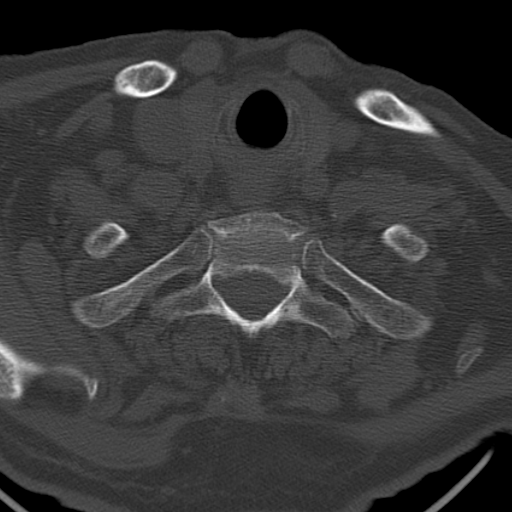
[im 13/83  bone]
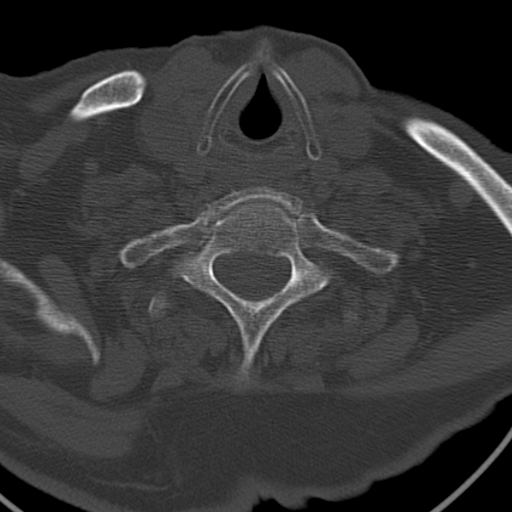
[im 19/83  bone]
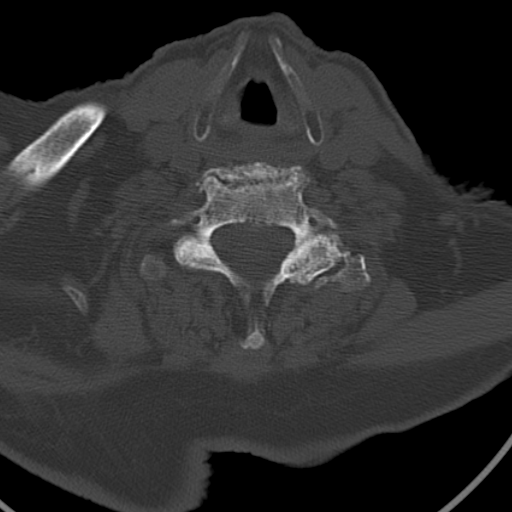
[im 26/83  bone]
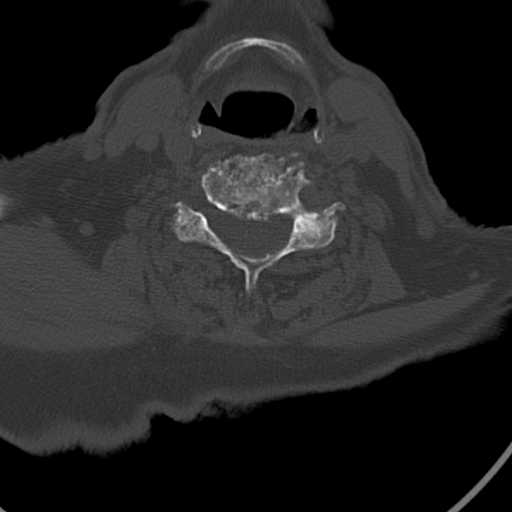
[im 32/83  soft-tissue]
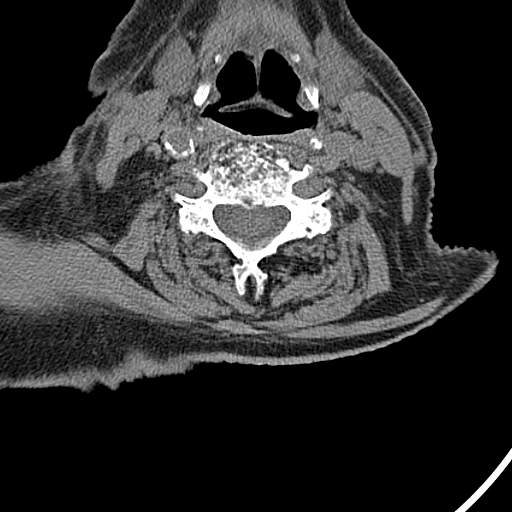
[im 32/83  bone]
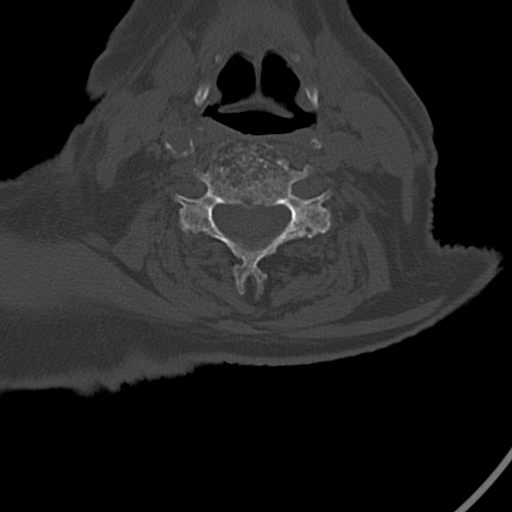
[im 38/83  bone]
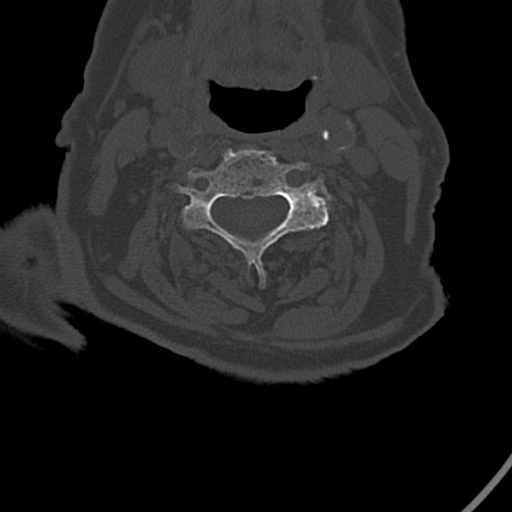
[im 45/83  bone]
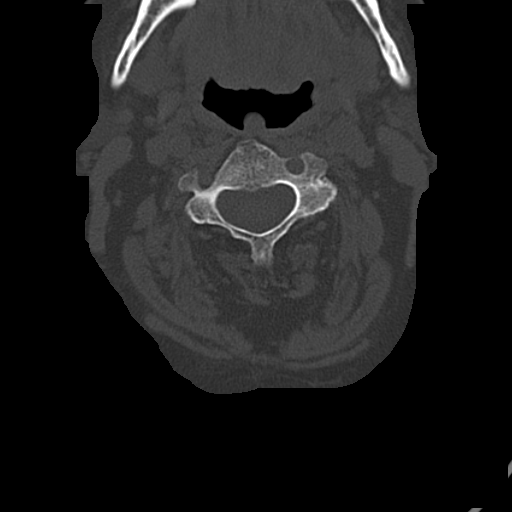
[im 51/83  bone]
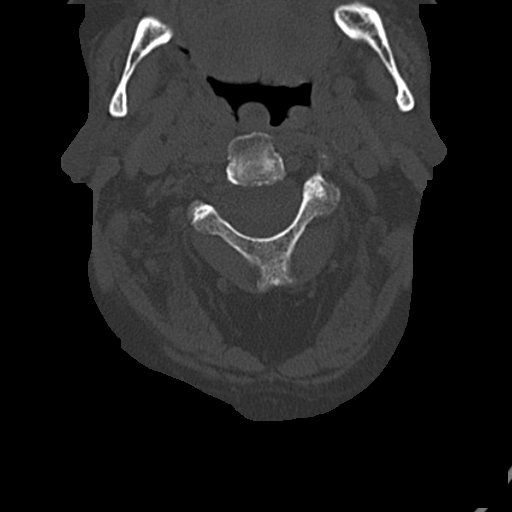
[im 57/83  soft-tissue]
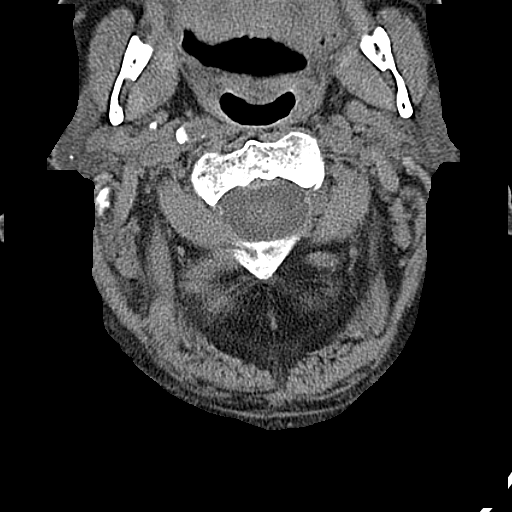
[im 57/83  bone]
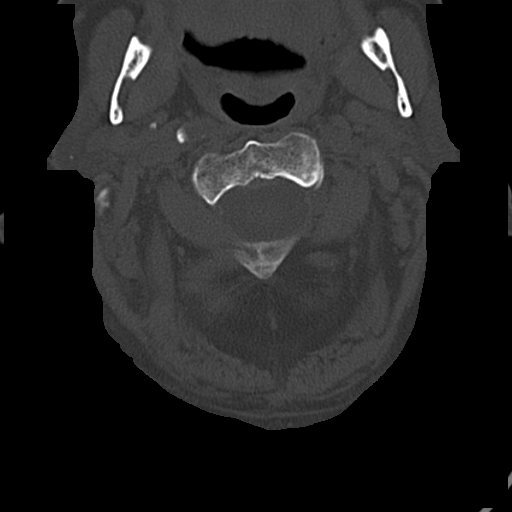
[im 64/83  bone]
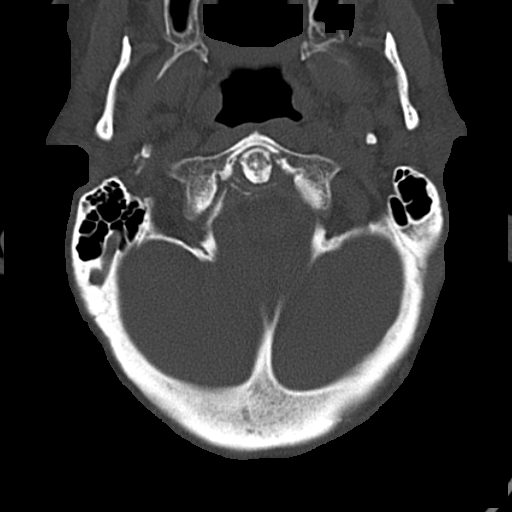
[im 70/83  bone]
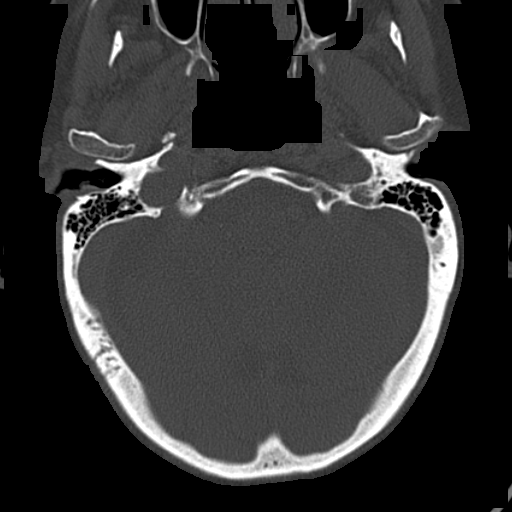
[im 76/83  bone]
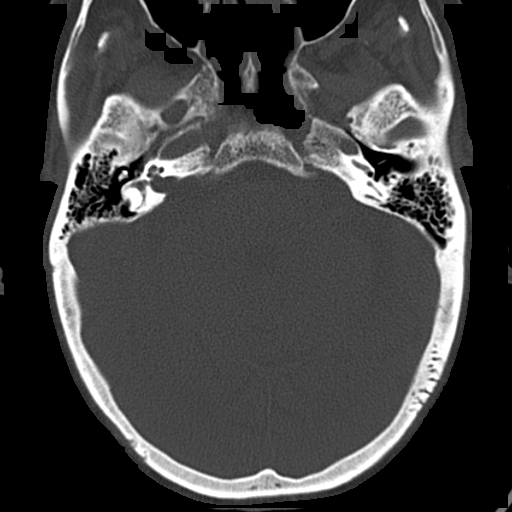

[12 of 14 positions shown; findings below may reference images not displayed]

FINDINGS: There is no evidence of fracture no dislocation . There is no
evidence of prevertebral soft tissue swelling nor canal stenosis. Multilevel
degenerative disease changes are appreciated within the mid and lower
cervical spine most severe at the C4-C5, C5-C6 and C6-C7 levels. There is
fusion secondary to disc space obliteration at the C4-C5 and C5-C6 level as
well as near-complete disc space obliteration at the C6-C7 level.
Degenerative changes are also evident at these levels, with regions of
subchondral cyst formation and subchondral sclerosis.
IMPRESSION: No evidence of acute osseous abnormalities severe
degenerative disc disease changes within the cervical spine.
2. No not mentioned above multilevel facet arthropathy moderate to severe
its also appreciated.

## 2012-09-01 ENCOUNTER — Inpatient Hospital Stay: Payer: Self-pay | Admitting: Student

## 2012-09-01 ENCOUNTER — Ambulatory Visit: Payer: Self-pay | Admitting: Internal Medicine

## 2012-09-01 LAB — COMPREHENSIVE METABOLIC PANEL
Alkaline Phosphatase: 103 U/L (ref 50–136)
Calcium, Total: 8.8 mg/dL (ref 8.5–10.1)
Chloride: 102 mmol/L (ref 98–107)
Co2: 29 mmol/L (ref 21–32)
EGFR (African American): 54 — ABNORMAL LOW
EGFR (Non-African Amer.): 47 — ABNORMAL LOW
Osmolality: 281 (ref 275–301)
SGPT (ALT): 18 U/L (ref 12–78)
Sodium: 139 mmol/L (ref 136–145)

## 2012-09-01 LAB — URINALYSIS, COMPLETE
Bilirubin,UR: NEGATIVE
Glucose,UR: NEGATIVE mg/dL (ref 0–75)
Protein: 30
RBC,UR: 1076 /HPF (ref 0–5)
WBC UR: 430 /HPF (ref 0–5)

## 2012-09-01 LAB — CBC
MCV: 85 fL (ref 80–100)
Platelet: 243 10*3/uL (ref 150–440)
RBC: 4.53 10*6/uL (ref 4.40–5.90)
WBC: 16 10*3/uL — ABNORMAL HIGH (ref 3.8–10.6)

## 2012-09-02 ENCOUNTER — Ambulatory Visit: Payer: Self-pay | Admitting: Internal Medicine

## 2012-09-02 LAB — CBC WITH DIFFERENTIAL/PLATELET
Basophil %: 0.3 %
Eosinophil #: 0.1 10*3/uL (ref 0.0–0.7)
HCT: 29.5 % — ABNORMAL LOW (ref 40.0–52.0)
HGB: 10.1 g/dL — ABNORMAL LOW (ref 13.0–18.0)
MCH: 28.4 pg (ref 26.0–34.0)
MCHC: 34.2 g/dL (ref 32.0–36.0)
Monocyte #: 0.9 x10 3/mm (ref 0.2–1.0)
Neutrophil #: 11.4 10*3/uL — ABNORMAL HIGH (ref 1.4–6.5)
Neutrophil %: 82.7 %
RDW: 16.5 % — ABNORMAL HIGH (ref 11.5–14.5)

## 2012-09-02 LAB — BASIC METABOLIC PANEL
Anion Gap: 12 (ref 7–16)
BUN: 24 mg/dL — ABNORMAL HIGH (ref 7–18)
Calcium, Total: 8.3 mg/dL — ABNORMAL LOW (ref 8.5–10.1)
Chloride: 106 mmol/L (ref 98–107)
Co2: 24 mmol/L (ref 21–32)
Creatinine: 1.21 mg/dL (ref 0.60–1.30)
EGFR (African American): 58 — ABNORMAL LOW
EGFR (Non-African Amer.): 50 — ABNORMAL LOW
Glucose: 85 mg/dL (ref 65–99)
Osmolality: 286 (ref 275–301)
Potassium: 3.8 mmol/L (ref 3.5–5.1)
Sodium: 142 mmol/L (ref 136–145)

## 2012-09-03 LAB — CBC WITH DIFFERENTIAL/PLATELET
Basophil #: 0 10*3/uL (ref 0.0–0.1)
Eosinophil #: 0.3 10*3/uL (ref 0.0–0.7)
HCT: 28.5 % — ABNORMAL LOW (ref 40.0–52.0)
Lymphocyte #: 0.7 10*3/uL — ABNORMAL LOW (ref 1.0–3.6)
MCH: 29.1 pg (ref 26.0–34.0)
MCHC: 34.9 g/dL (ref 32.0–36.0)
MCV: 83 fL (ref 80–100)
Monocyte #: 0.5 x10 3/mm (ref 0.2–1.0)
Monocyte %: 7.3 %
Neutrophil #: 4.8 10*3/uL (ref 1.4–6.5)
Neutrophil %: 76.3 %
Platelet: 153 10*3/uL (ref 150–440)
RBC: 3.42 10*6/uL — ABNORMAL LOW (ref 4.40–5.90)
RDW: 16.3 % — ABNORMAL HIGH (ref 11.5–14.5)

## 2012-09-03 LAB — URINE CULTURE

## 2012-09-07 LAB — CULTURE, BLOOD (SINGLE)

## 2012-10-03 ENCOUNTER — Ambulatory Visit: Payer: Self-pay | Admitting: Internal Medicine

## 2012-10-13 IMAGING — CR LEFT WRIST - COMPLETE 3+ VIEW
1 series · 4 of 4 positions shown · non-contrast
Comparison: none

REASON FOR EXAM: swelling, pain
COMMENTS:

PROCEDURE:     DXR - DXR WRIST LT COMP WITH OBLIQUES  - March 15, 2011 [DATE]
RESULT:     There is degenerative change in the first carpometacarpal joint
and in the radiocarpal joint. No definite fracture or bony destruction is
appreciated.

[Series 2: view not recorded · 0.17mm/px · 4 of 4 slices shown]
[im 1/4]
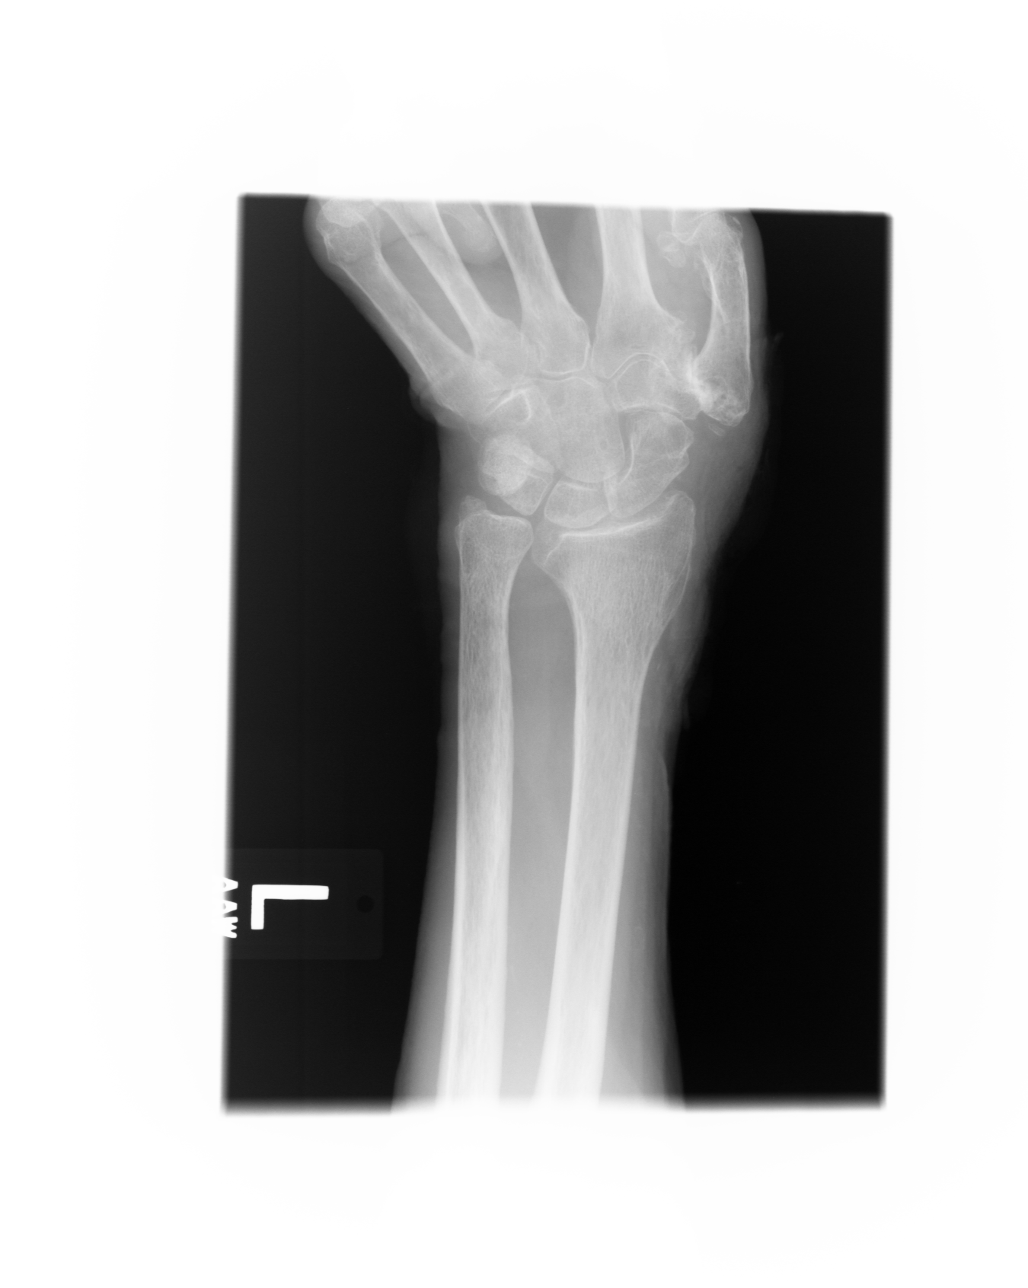
[im 2/4]
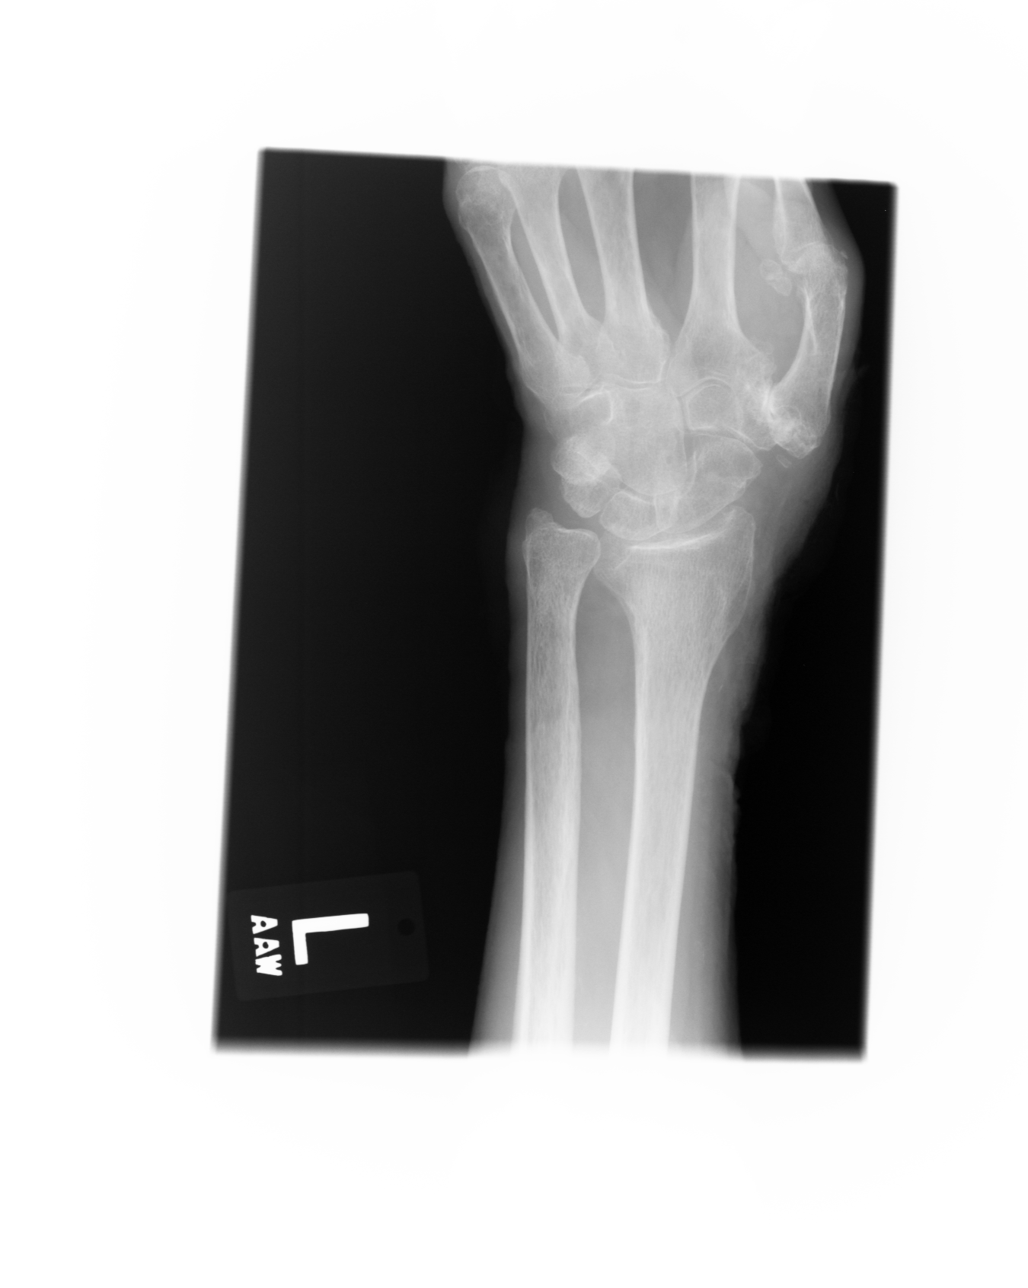
[im 3/4]
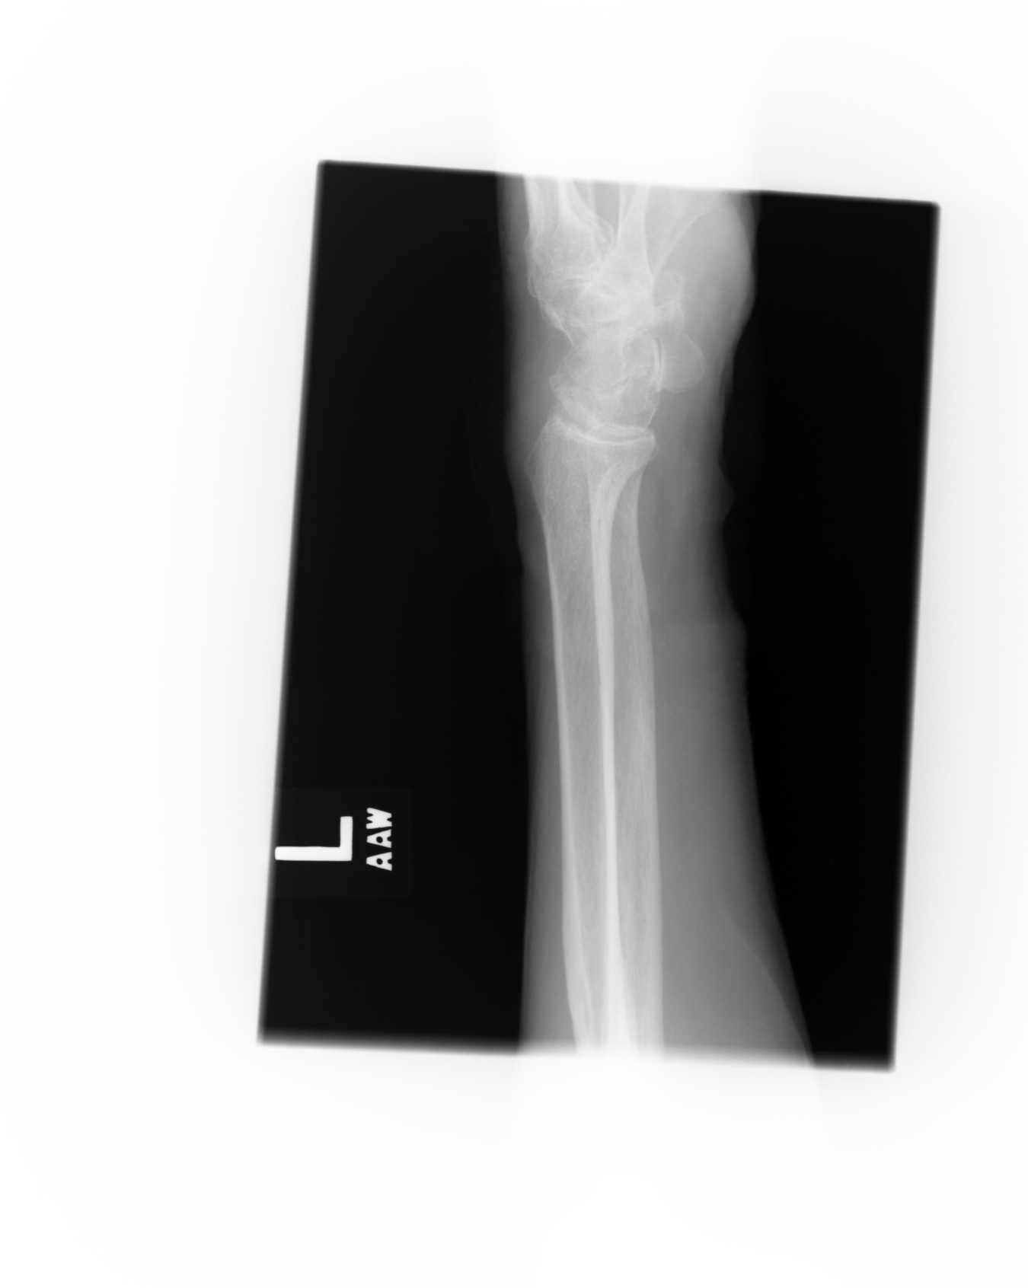
[im 4/4]
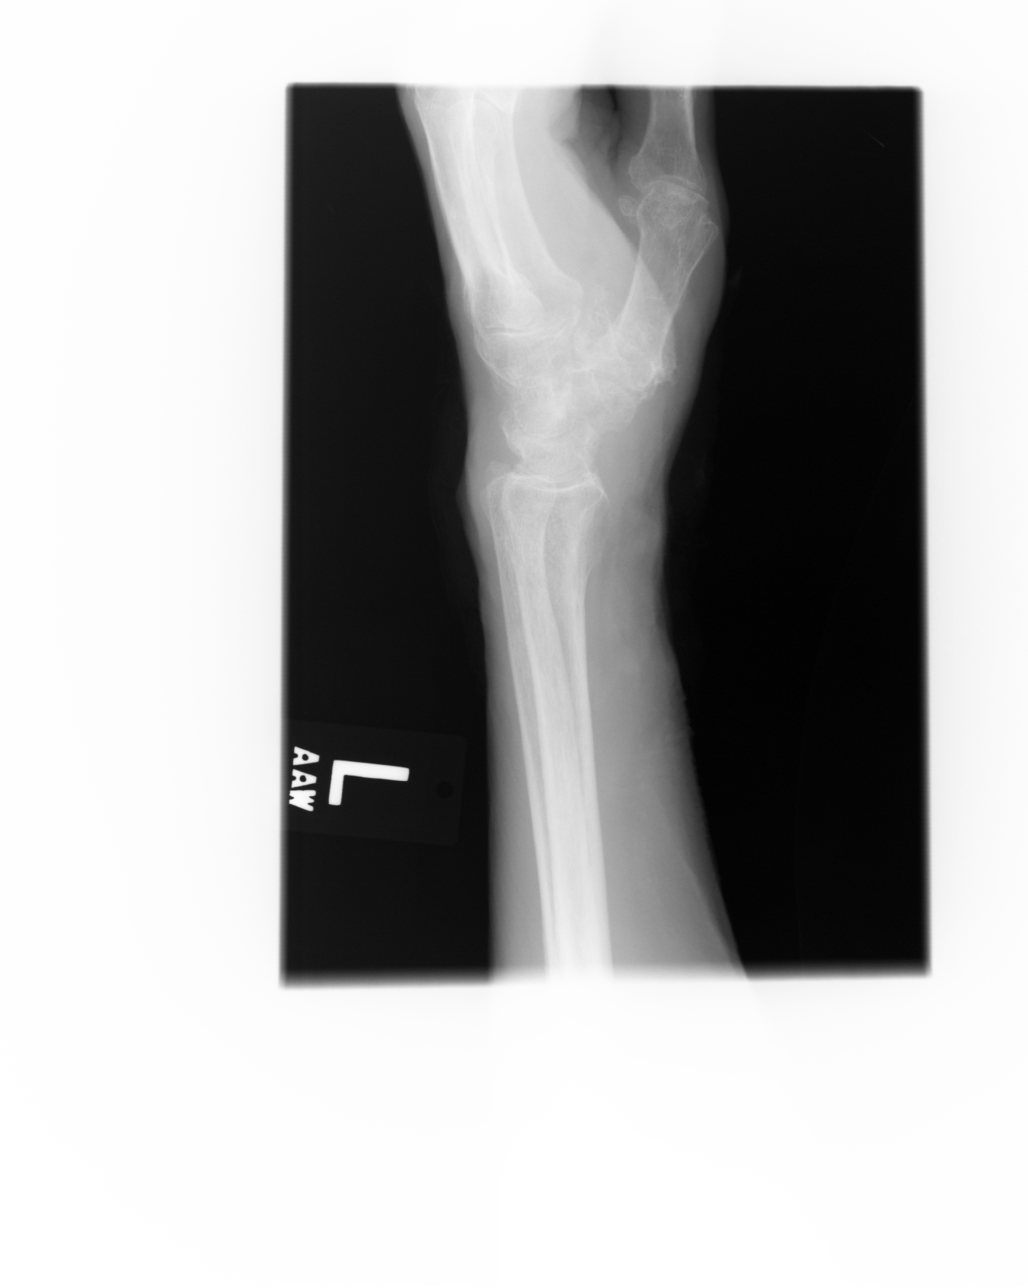

[4 of 4 positions shown; findings below may reference images not displayed]

IMPRESSION: Osteopenia with degenerative change. If the patient is
symptomatic for a scaphoid fracture, follow-up MRI may be beneficial. There
is some sclerosis in the distal portion of the scaphoid which is unchanged
compared to 11/13/2009.

## 2012-10-21 ENCOUNTER — Emergency Department: Payer: Self-pay | Admitting: Emergency Medicine

## 2012-10-21 LAB — URINALYSIS, COMPLETE
Ketone: NEGATIVE
Leukocyte Esterase: NEGATIVE
Nitrite: NEGATIVE
Ph: 8 (ref 4.5–8.0)
Protein: NEGATIVE
RBC,UR: <1 /HPF (ref 0–5)
WBC UR: <1 /HPF (ref 0–5)

## 2012-10-21 LAB — BASIC METABOLIC PANEL
Anion Gap: 6 — ABNORMAL LOW (ref 7–16)
BUN: 16 mg/dL (ref 7–18)
Calcium, Total: 8.5 mg/dL (ref 8.5–10.1)
Chloride: 105 mmol/L (ref 98–107)
Creatinine: 1.02 mg/dL (ref 0.60–1.30)
Glucose: 87 mg/dL (ref 65–99)
Osmolality: 280 (ref 275–301)
Sodium: 140 mmol/L (ref 136–145)

## 2012-10-21 LAB — CBC
HCT: 30 % — ABNORMAL LOW (ref 40.0–52.0)
MCH: 27.3 pg (ref 26.0–34.0)
MCHC: 32.7 g/dL (ref 32.0–36.0)
Platelet: 284 10*3/uL (ref 150–440)
RDW: 17.2 % — ABNORMAL HIGH (ref 11.5–14.5)

## 2013-02-02 ENCOUNTER — Emergency Department: Payer: Self-pay | Admitting: Emergency Medicine

## 2013-02-02 LAB — COMPREHENSIVE METABOLIC PANEL
Albumin: 2.3 g/dL — ABNORMAL LOW (ref 3.4–5.0)
Alkaline Phosphatase: 153 U/L — ABNORMAL HIGH (ref 50–136)
BUN: 39 mg/dL — ABNORMAL HIGH (ref 7–18)
Co2: 26 mmol/L (ref 21–32)
EGFR (African American): 58 — ABNORMAL LOW
Glucose: 103 mg/dL — ABNORMAL HIGH (ref 65–99)
Osmolality: 291 (ref 275–301)
Potassium: 4.1 mmol/L (ref 3.5–5.1)
Total Protein: 5.7 g/dL — ABNORMAL LOW (ref 6.4–8.2)

## 2013-02-02 LAB — CBC WITH DIFFERENTIAL/PLATELET
Basophil #: 0 10*3/uL (ref 0.0–0.1)
Basophil %: 0.4 %
Eosinophil #: 0.1 10*3/uL (ref 0.0–0.7)
Eosinophil %: 0.4 %
HCT: 32.9 % — ABNORMAL LOW (ref 40.0–52.0)
Lymphocyte %: 2.4 %
MCHC: 32.5 g/dL (ref 32.0–36.0)
Neutrophil #: 11.8 10*3/uL — ABNORMAL HIGH (ref 1.4–6.5)
Platelet: 167 10*3/uL (ref 150–440)
RBC: 3.98 10*6/uL — ABNORMAL LOW (ref 4.40–5.90)
RDW: 17.2 % — ABNORMAL HIGH (ref 11.5–14.5)

## 2013-02-02 LAB — URINALYSIS, COMPLETE
Bacteria: NONE SEEN
Bilirubin,UR: NEGATIVE
Ketone: NEGATIVE
Leukocyte Esterase: NEGATIVE
Nitrite: NEGATIVE
Ph: 7 (ref 4.5–8.0)
Protein: 30
RBC,UR: 73 /HPF (ref 0–5)
Squamous Epithelial: NONE SEEN

## 2013-02-03 ENCOUNTER — Inpatient Hospital Stay: Payer: Self-pay | Admitting: Internal Medicine

## 2013-02-03 LAB — CBC
MCH: 27.1 pg (ref 26.0–34.0)
MCHC: 32.7 g/dL (ref 32.0–36.0)
Platelet: 161 10*3/uL (ref 150–440)
RBC: 3.84 10*6/uL — ABNORMAL LOW (ref 4.40–5.90)
RDW: 17.6 % — ABNORMAL HIGH (ref 11.5–14.5)
WBC: 8.2 10*3/uL (ref 3.8–10.6)

## 2013-02-03 LAB — COMPREHENSIVE METABOLIC PANEL
Albumin: 2.1 g/dL — ABNORMAL LOW (ref 3.4–5.0)
Alkaline Phosphatase: 146 U/L — ABNORMAL HIGH (ref 50–136)
Anion Gap: 6 — ABNORMAL LOW (ref 7–16)
BUN: 28 mg/dL — ABNORMAL HIGH (ref 7–18)
Calcium, Total: 7.9 mg/dL — ABNORMAL LOW (ref 8.5–10.1)
Chloride: 110 mmol/L — ABNORMAL HIGH (ref 98–107)
Co2: 25 mmol/L (ref 21–32)
EGFR (African American): 51 — ABNORMAL LOW
EGFR (Non-African Amer.): 44 — ABNORMAL LOW
Glucose: 126 mg/dL — ABNORMAL HIGH (ref 65–99)
Osmolality: 288 (ref 275–301)
Potassium: 4.1 mmol/L (ref 3.5–5.1)
SGOT(AST): 32 U/L (ref 15–37)
Sodium: 141 mmol/L (ref 136–145)

## 2013-02-03 LAB — URINALYSIS, COMPLETE
Bacteria: NONE SEEN
Bilirubin,UR: NEGATIVE
Glucose,UR: NEGATIVE mg/dL (ref 0–75)
Leukocyte Esterase: NEGATIVE
Ph: 5 (ref 4.5–8.0)
Specific Gravity: 1.017 (ref 1.003–1.030)

## 2013-02-03 LAB — PRO B NATRIURETIC PEPTIDE: B-Type Natriuretic Peptide: 14720 pg/mL — ABNORMAL HIGH (ref 0–450)

## 2013-02-03 LAB — URINE CULTURE

## 2013-02-04 LAB — CBC WITH DIFFERENTIAL/PLATELET
Basophil #: 0 10*3/uL (ref 0.0–0.1)
Basophil %: 0.6 %
Eosinophil #: 0.1 10*3/uL (ref 0.0–0.7)
HGB: 9.5 g/dL — ABNORMAL LOW (ref 13.0–18.0)
Lymphocyte #: 0.7 10*3/uL — ABNORMAL LOW (ref 1.0–3.6)
Lymphocyte %: 10.1 %
MCH: 27.4 pg (ref 26.0–34.0)
MCHC: 33.4 g/dL (ref 32.0–36.0)
MCV: 82 fL (ref 80–100)
Monocyte #: 0.6 x10 3/mm (ref 0.2–1.0)
Monocyte %: 9.6 %
Neutrophil #: 5 10*3/uL (ref 1.4–6.5)
Platelet: 144 10*3/uL — ABNORMAL LOW (ref 150–440)
RBC: 3.48 10*6/uL — ABNORMAL LOW (ref 4.40–5.90)

## 2013-02-04 LAB — BASIC METABOLIC PANEL
BUN: 25 mg/dL — ABNORMAL HIGH (ref 7–18)
Calcium, Total: 7.9 mg/dL — ABNORMAL LOW (ref 8.5–10.1)
Chloride: 111 mmol/L — ABNORMAL HIGH (ref 98–107)
Co2: 22 mmol/L (ref 21–32)
Creatinine: 0.96 mg/dL (ref 0.60–1.30)
Glucose: 88 mg/dL (ref 65–99)
Osmolality: 289 (ref 275–301)
Sodium: 143 mmol/L (ref 136–145)

## 2013-02-04 LAB — TROPONIN I: Troponin-I: 0.03 ng/mL

## 2013-02-04 LAB — CK TOTAL AND CKMB (NOT AT ARMC)
CK, Total: 13 U/L — ABNORMAL LOW (ref 35–232)
CK-MB: 0.8 ng/mL (ref 0.5–3.6)

## 2013-02-05 LAB — URINE CULTURE

## 2013-02-07 LAB — CULTURE, BLOOD (SINGLE)

## 2013-02-09 LAB — CULTURE, BLOOD (SINGLE)

## 2013-06-02 DEATH — deceased

## 2014-03-04 IMAGING — CR DG CHEST 2V
1 series · 2 of 2 positions shown · non-contrast
Comparison: none

REASON FOR EXAM: rpt -please do best to ensure deep inspiration
COMMENTS:

[Series 1: ap · 0.17mm/px · 2 of 2 slices shown]
[im 1/2]
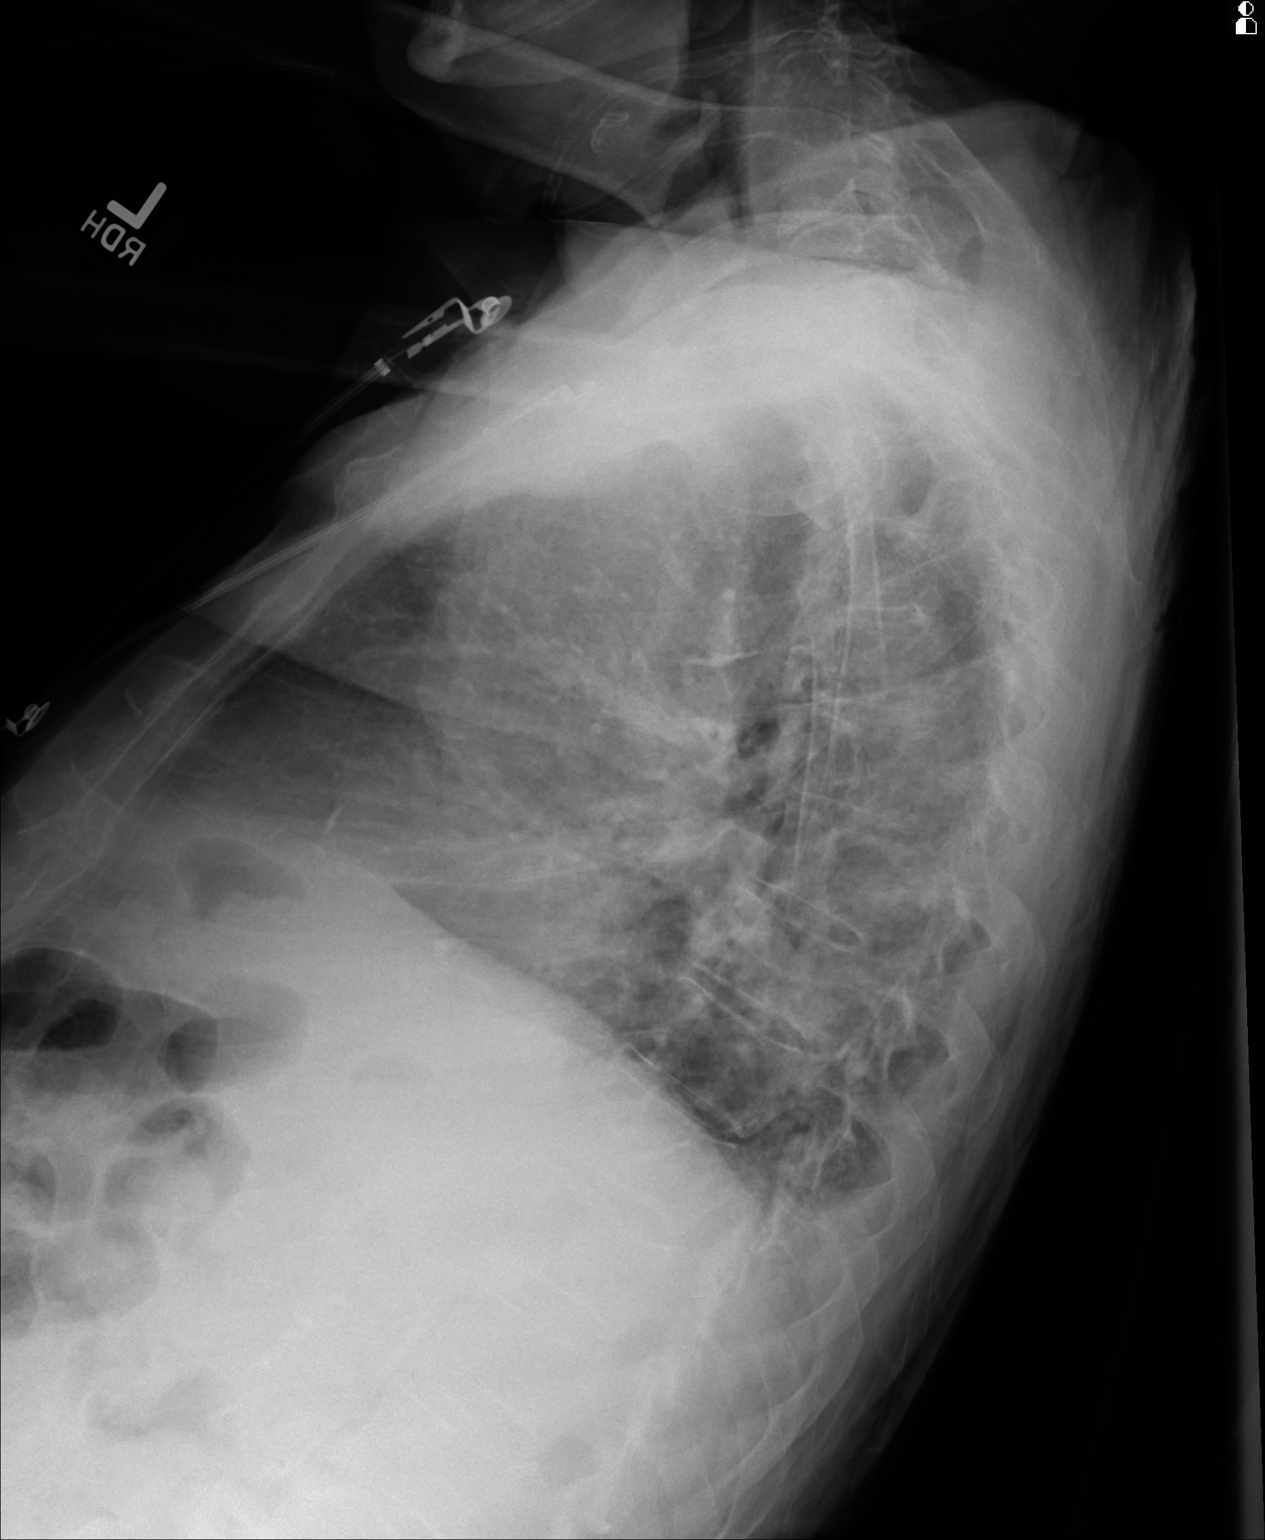
[im 2/2]
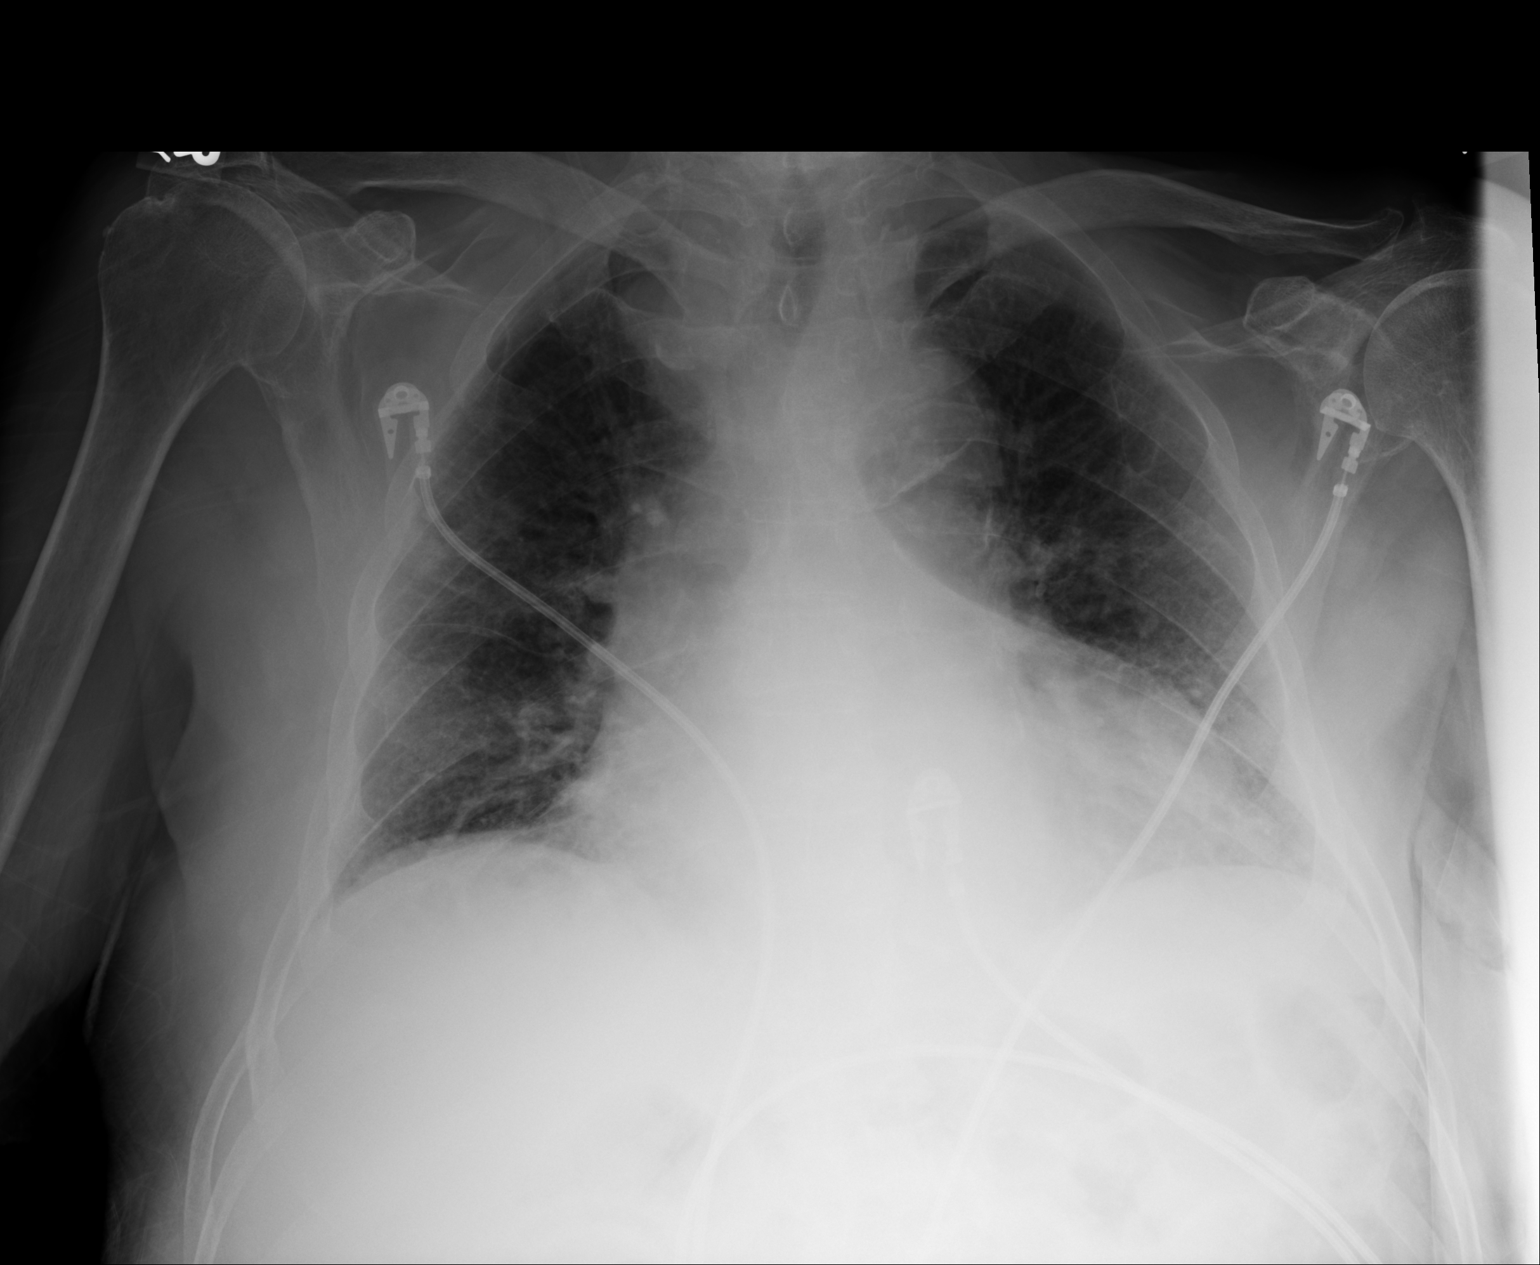

[2 of 2 positions shown; findings below may reference images not displayed]

PROCEDURE:     DXR - DXR CHEST PA (OR AP) AND LATERAL  - August 03, 2012  [DATE]

RESULT:     Comparison is made to the study 03 August, 2012.

The cardiac silhouette is enlarged. There is thickening in the major and
minor fissures with a trace pleural effusion suggested. Interstitial
prominence and pulmonary vascular congestion is present. There appears to be
ectasia of the thoracic aorta. Present basilar atelectasis. Infiltrate is
not excluded. There is no pneumothorax.
IMPRESSION: Cardiomegaly with basilar atelectasis, trace effusion and
mild interstitial edema versus infiltrate a vague nonedematous origin.
Correlate for congestive failure.

[REDACTED]

## 2015-03-22 NOTE — H&P (Signed)
PATIENT NAME:  [name] Schwartz, [name] Schwartz MR#:  [id number] DATE OF BIRTH:  1914-01-29  DATE OF ADMISSION:  09/01/2012  PRIMARY CARE PHYSICIAN:  Non-local REFERRING PHYSICIAN: Dr. Jens SomWiegand   CHIEF COMPLAINT:  Vomiting and fever today.  HISTORY OF PRESENT ILLNESS: The patient is a [age] year old Caucasian male with a history of diastolic congestive heart failure, hypertension, anemia, and Alzheimer's dementia who presented to the  ED with the above chief complaint. The patient is a nursing home resident and was noted to have nausea, vomiting, and tremor today and was then sent to the ED for further evaluation. The patient had a fever at 102 in the ED and urinalysis showed urinary tract infection. He was treated with antibiotics in the Emergency Department and admitted for urinary tract infection.   PAST MEDICAL HISTORY: As mentioned above:  1. Hypertension.  2. Anemia. 3. Diastolic congestive heart failure.  4. Alzheimer's dementia. 5. Peptic ulcer disease. 6. Decreased vision and hearing.  7. Glaucoma.   REVIEW OF SYSTEMS: The patient is demented and unable to provide any information or review of systems.   ALLERGIES: Barbiturate, Celebrex, codeine.  MEDICATIONS: 1. Vitamin B12 1000 mcg p.o. daily.  2. Senna Lax  8.6 mg p.o. tablets once daily. 3. Oyster shell calcium with vitamin D 500 mg/200 international units p.o. tablets, 1 tablet b.i.d. Omeprazole 20 mg p.o. once daily.  4. Mapap 500-mg tablet every four hours p.r.n.  5. Lasix 20 mg p.o. once daily.  6. Aspirin 81 mg p.o. daily.   PHYSICAL EXAMINATION:  VITALS: Temperature 102.6, blood pressure was 136/66 and now is 102/53, pulse 84, respirations 24, oxygen saturation 93% on room air.   GENERAL: The patient is awake but demented, nonverbal, in no acute distress.   HEENT: Pupils are round, equal, and reactive to light. No discharge from ears or nose. Unable to do oral exam.   NECK: Supple. No JVD or carotid bruit. No lymphadenopathy. No  thyromegaly.   CARDIOVASCULAR: S1, S2 regular rate and rhythm. No murmurs or gallops.   PULMONARY: Bilateral air entry. No wheezing or rales. No use of accessory muscles to breathe.   ABDOMEN: Soft. No distention. Bowel sounds present. No organomegaly.   EXTREMITIES: No edema, clubbing, or cyanosis. Strong bilateral pedal pulses.   SKIN: No rash or jaundice.   NEUROLOGY: The patient is awake but demented and unable to do a neuro exam.   LABORATORY, DIAGNOSTIC, AND RADIOLOGICAL DATA: Lactic acid 2.2. Urinalysis shows RBC 1076, WBCs  430.  CBC shows WBC 16, hemoglobin 12.7, platelets 243, glucose 114, BUN 21, creatinine 1.28. Electrolytes are normal. Troponin less than 0.02. EKG shows sinus rhythm with first-degree AV block at 79 beats per minute.   IMPRESSION:  1. Urinary tract infection.  2. Sepsis.  3. Leukocytosis.  4. Lactic acidosis.  5. Dehydration.  6. Anemia.  7. Congestive heart failure, diastolic dysfunction, stable.  8. Hypertension.   PLAN OF TREATMENT:  1. The patient will be admitted to the medical floor. We will start Zosyn and vancomycin and follow up CBC, blood culture, and urine culture.  2. We will start normal saline 75 mL/h and follow up basic metabolic panel.  3. We will hold Lasix due to blood pressure decreased to 103.  4. GI and deep vein thrombosis prophylaxis.   TIME SPENT: About 50 minutes.    ____________________________ Shaune PollackQing Aniyla Harling, MD qc:bjt D: 09/01/2012 15:42:50 ET T: 09/01/2012 16:17:00 ET JOB#: [id number]  cc: Shaune PollackQing Cheryln Balcom, MD, <Dictator> Shaune PollackQING Devesh Monforte MD ELECTRONICALLY SIGNED  09/03/2012 15:09 

## 2015-03-22 NOTE — Consult Note (Signed)
Impression: 81yo WM w/ h/o dementia admitted with MS changes who has CNS in the blood.  His BCx are growing two different species of CNS.  This is highly suggestive of contamination.  Would not treat the positive BCx. He presented without fever or leukocytosis.  He has been on 2L since admission, but his sats have been in the mid to upper 90s.  His CXR did not show infiltates, but edema.  There is little to suggest pneumonia or any active infection. Will stop all his antibiotics. Will sign off.  Please call with questions.   Electronic Signatures: Niara Bunker, Rosalyn GessMichael E (MD)  (Signed on 05-Sep-13 14:41)  Authored  Last Updated: 05-Sep-13 14:41 by Harman Ferrin, Rosalyn GessMichael E (MD)

## 2015-03-22 NOTE — Discharge Summary (Signed)
PATIENT NAME:  [name] Schwartz, [name] MR#:  [id number] DATE OF BIRTH:  [birth year]  DATE OF ADMISSION:  08/03/2012 DATE OF DISCHARGE:  08/08/2012  PRIMARY CARE PHYSICIAN: Nonlocal CONSULTING PHYSICIAN: Dr. Leavy CellaBlocker  DISCHARGE DIAGNOSES:  1. Acute diastolic congestive heart failure.  2. Metabolic encephalopathy.  3. Hypoxia.  4. Anemia.  5. Dementia.   CODE STATUS: DO NOT RESUSCITATE.   CONDITION: Stable.   HOME MEDICATIONS:  1. Vitamin B12 1000 mcg p.o. tablets once daily.  2. Omeprazole 20 mg p.o. daily.  3. Aspirin 81 mg p.o. daily. 4. Cenalax 8.6 mg p.o. daily.  5. Oyster shell calcium with vitamin D 500 mg/200 international units oral tablet 1 tablet p.o. daily.  6. Mapap 500 mg p.o. tablets 1 tablet q.4 hours p.r.n.  7. Lasix 20 mg p.o. daily.   DIET: Low sodium diet with puree and nectar thick liquids. Please see diet special instructions for details.    ACTIVITY: As tolerated.   FOLLOW-UP CARE: Follow up with PCP within 1 to 2 weeks.   HOSPITAL COURSE: Patient is a 81 year old Caucasian male with history of hypertension, PUD, anemia, Alzheimer's dementia presented to the ED with very poorly responsiveness and weakness. For detailed history and physical examination please refer to the admission note dictated by Dr. Katharina Caperima Vaickute on admission date. Patient's CAT scan of head without contrast revealed involutional changes without evidence of acute abnormality. Chest x-ray showed interstitial prominence, possible pulmonary edema versus inflammatory infiltrate. Urinalysis negative for urinary tract infection. ABG showed pH 7.45, pCO2 37, pO2 63, oxygen saturation 95% on room air. BNP 1597. Echocardiogram is normal. Patient was admitted for bacterial pneumonia and hypoxia and metabolic encephalopathy. After admission patient had been placed on Zithromax and Zosyn. Since patient's blood culture two sets were positive for staph epidermidis, we requested ID consult from Dr. Leavy CellaBlocker, but  according to him this is possible contamination. In addition he reviewed chest x-ray and he suggests patient has no pneumonia according to patient's clinical symptoms and vital signs and chest x-ray so he discontinued antibiotics.    Acute diastolic congestive heart failure. Patient admitted for hypoxia and pulmonary edema and echocardiogram showed normal ejection fraction, however, patient's BNP is elevated which is possibly due to acute diastolic congestive heart failure. After admission patient has been treated with Lasix 20 mg IV b.i.d. Patient's symptoms have much improved, however, patient's blood pressure on the lower side today 99/57 so we will decrease Lasix dose 20 mg p.o. daily after discharge. Patient is clinically stable. Vital signs today: Temperature 98, blood pressure 99/60, oxygen saturation 97%. Patient is clinically stable, will be discharged to nursing home today.   Discussed the patient's discharge plan with nurse and case manager.   TIME SPENT: About 38 minutes.  ____________________________ [name], MD qc:cms D: 08/08/2012 13:08:53 ET T: 08/08/2012 13:27:54 ET JOB#: [id number]  cc: [name], MD, <Dictator> [name] MD ELECTRONICALLY SIGNED 08/09/2012 13:59

## 2015-03-22 NOTE — H&P (Signed)
PATIENT NAME:  [name] Schwartz, [name] Schwartz DATE OF BIRTH:  [birth year]  DATE OF ADMISSION:  08/03/2012  PRIMARY CARE PHYSICIAN: Merlene PullingMary Beth McGranaghan, [place]  HISTORY OF PRESENT ILLNESS: The patient is a 81 year old Caucasian male with past medical history significant for history of hypertension, peptic ulcer disease, history of anemia, and macular degeneration as well as decreased hearing, Alzheimer dementia, and glaucoma with most recent admission for syncope due to atenolol in April 2013 who presents back to the hospital due to poor responsiveness. Apparently according to the nursing facility as well as the emergency room physician the patient was very poorly responsive just today, yesterday was doing quite okay. She is usually active and talkative and responding to questions. Now is not able to do so. Now he is much less responsive, he is very weak, he was not able to stand up here in the Emergency Room, and could not walk, usually just stares at you with no response despite yelling into his ear questions. Hospitalist services were contacted for admission.   PAST MEDICAL HISTORY:  1. Admission for syncope due to atenolol in April 2013. 2. Hypertension. 3. Anemia. 4. Macular degeneration. 5. Alzheimer's dementia. 6. Glaucoma. 7. Peptic ulcer disease. 8. Gastric ulcers. 9. Decreased hearing. 10. Decreased vision.  PAST SURGICAL HISTORY: Left hip hemiarthroplasty.   ALLERGIES: According to medical records, the patient is allergic to Celebrex, barbiturates and Codeine.  SOCIAL HISTORY: Quit smoking 20 years ago. No alcohol or drug abuse. [facility]. Usually walks with a walker and assistance. No one knows his family members.   FAMILY HISTORY: The patient's father died at the age of 79 of tuberculosis.   NOTE: The patient's mother died at age of 79 due to heart problems.   REVIEW OF SYSTEMS: Not available as the patient is very poorly responsive, barely waking up.    PHYSICAL EXAMINATION:  VITALS: On arrival to the Emergency Room, the patient's temperature is 96.7, pulse 63, respiratory rate 16, blood pressure 131/64, and saturation 95% on oxygen therapy.   GENERAL: This is a well-nourished, Caucasian male lying on the stretcher in no significant distress.   HEENT: Pupils are equal, 2 mm, and reactive to light. Extraocular movements are intact. No icterus or conjunctivitis. Has significant difficulty hearing. He would answer questions whenever you yell into his ear. No oropharynx erythema. Mucosa is moist.  NECK: No masses, supple and nontender. Thyroid not enlarged. No adenopathy, no JVD or carotid bruits bilaterally. Full range of motion.   LUNGS: Rales at bases, a few rhonchi were heard, clear to auscultation although diminished breath sounds bilaterally. No significant wheezing. No labored inspiration, increased effort, or dullness to percussion. Not in overt respiratory distress.   CARDIOVASCULAR: S1 and S2 appreciated. No murmurs, gallops, or rubs noted, distant. PMI not localized. Chest is nontender to palpation.   EXTREMITIES: Diminished pedal pulses. No lower extremity edema, clubbing, or cyanosis was noted.   ABDOMEN: Soft and nontender. Bowel sounds are present. No hepatosplenomegaly or masses were noted.   RECTAL: Deferred.   MUSCULOSKELETAL: Muscle strength - difficult to evaluate as the patient is not able to move much, in fact he perked up a little bit, and he became more alert here in the emergency room, however, he could not stand up and could not walk normally. He would walk with a walker. During my evaluation, he is poorly responsive and not cooperative with evaluation, not able to assess him for kyphosis. Gait was not tested.  SKIN: No rashes, lesions, erythema, nodularity, or induration. It was warm and dry to palpation.   LYMPH: No adenopathy in the cervical region.   NEUROLOGICAL: Cranial nerves grossly intact, however, the  patient did seem to have a little drooping of his right lip and shallow nasolabial fold. Otherwise, I am not able to evaluate. DTRs are intact. No able to assess for sensory. No significant dysarthria whenever he is able to say a word or two. No aphasia.   PSYCH: The patient is very somnolent, not oriented, and not cooperative. Memory is impaired.   LABORATORY, DIAGNOSTIC AND RADIOLOGIC DATA: EKG showed sinus rhythm at 69 beats minutes with sinus arrhythmia, first degree AV block, left axis deviation, incomplete left bundle branch block, and no acute ST-T changes however were noted. His prior EKG also showed left axis deviation as well as septal infarct. No significant change since prior EKG done in July 2013.  BMP showed BNP of 1597. The patient's blood glucose level was 90 and 92 on later evaluation. His kidney function is normal. Otherwise BMP was unremarkable. The patient's hemoglobin level was 3.0, otherwise unremarkable study. Cardiac enzymes x2 were normal. The patient's white blood cell count was 6.1, hemoglobin 11.9, and platelet count 93.   Urinalysis: Yellow-clear urine, negative for glucose, bilirubin or ketones, specific gravity 1.017, pH 7.0, and negative for blood, protein, nitrites, and leukocyte esterase, 1 red blood cell, less than 1 white blood cell, and no bacteria or epithelial cells were noted.   ABGs were done on room air and showed pH of 7.45, pCO2 37, pO2 63, and saturation 95% on room air.   Chest x-ray, portable single view, on 08/03/2012, showed interstitial prominence a component of which is accentuated by shallow inspiration, and underlying component of pulmonary edema, an edematous versus inflammatory infiltrate cannot be excluded. Atelectasis versus infiltrate in lung bases. Repeat evaluation with deeper inspiration was recommended.   CT of head without contrast, on 08/03/2012, revealed involutional changes without evidence of acute abnormalities.   ASSESSMENT AND  PLAN:  1. Bacterial pneumonia. Admit the patient to the medical floor, initiate antibiotic therapy with two antibiotics, Rocephin as well as Zithromax. Get sputum cultures if possible. Adjust antibiotics according to culture results. Ask speech therapist to evaluate the patient to rule out aspiration problems.  2. Metabolic encephalopathy likely due to hypoxia as well as pneumonia.  3. Weakness. Get neuro checks, get speech therapist evaluation. The patient is DO NOT RESUSCITATE. 4. Hypoxia likely due to pneumonia, questionable interstitial edema. Get echocardiogram and continue oxygen therapy as needed.  5. Anemia. Get guaiac.   TIME SPENT: 50 minutes.  ____________________________ Katharina Caper, MD rv:slb D: 08/03/2012 16:51:57 ET T: 08/04/2012 09:16:13 ET JOB#: [id number]  cc: Katharina Caper, MD, <Dictator> Merlene Pulling, [place] Nole Robey Winona Legato MD ELECTRONICALLY SIGNED 08/13/2012 22:11

## 2015-03-22 NOTE — Discharge Summary (Signed)
PATIENT NAME:  [name] SimpsonNDERSON, [name] Schwartz MR#:  [id number] DATE OF BIRTH:  May 16, 1914  DATE OF ADMISSION:  09/01/2012 DATE OF DISCHARGE:  09/03/2012  CONSULTANTS:  1. Dr. Harvie JuniorPhifer, palliative care. 2. Speech and swallow evaluation.   CHIEF COMPLAINT: Vomiting and fever.   DISCHARGE DIAGNOSES:  1. Sepsis in the setting of urinary tract infection.  2. Dysphagia. 3. Chronic diastolic congestive heart failure.  4. Advanced Alzheimer's dementia.  5. History of peptic ulcer disease.  6. Decreased vision and hearing. 7. Glaucoma.   DISCHARGE MEDICATIONS:  1. Vitamin B12 1000 mcg daily.  2. Omeprazole 20 mg daily.  3. Aspirin 81 mg daily.  4. Senna laxative 8.6 mg daily. 5. Oyster shell calcium with vitamin D 500/200 international unit 2 times a day.  6. Mapap 500 mg every four hours as needed for pain or fever.  7. Lasix 20 mg once a day.  8. Cephalexin 500 mg every 12 hours for seven days.   DIET: Low sodium. Consistency is puree, honey thick liquids. No straws.   ACTIVITY: As tolerated.   FOLLOWUP:  Please follow up with your primary care physician within 1 to 2 weeks.   DISPOSITION: Back to [facility].   HISTORY OF PRESENT ILLNESS: For full details of the history and physical, please see the dictation on 09/30 by Dr. Imogene Burnhen. Briefly, this is a 81 year old male with a history of diastolic congestive heart failure, hypotension, anemia, and advanced Alzheimer's who lives in the memory care unit, presenting with vomiting, tremors, noted to have a fever of 102 and a positive urinalysis and was therefore admitted to the hospitalist service.   SIGNIFICANT LABORATORY DATA AND IMAGING: Initial creatinine 1.28, BUN 21, glucose 114. LFTs within normal limits. Troponin negative. Initial WBC 16, and on the day of discharge 6.3. Initial hemoglobin 12.7, day of discharge 9.9.  Platelets initially 243, day of discharge 153.  Blood cultures on arrival: No growth to date. Urine culture on  arrival showing Escherichia coli sensitive to cefazolin and ceftriaxone. Lactic acid on arrival 2.2. Initial urinalysis: Nitrate positive, leukocyte esterase positive. RBCs 1076. WBCs 430. Chest x-ray on arrival: No definitive evidence of pneumonia. Cannot exclude low-grade compensated congestive heart failure.   HOSPITAL COURSE: The patient was admitted to the hospitalist service and started on broad-spectrum antibiotics including vancomycin and Zosyn. Blood cultures, urine cultures, and x-ray of the chest were obtained. Blood cultures have been no growth to date. However, the patient did have a positive urinalysis as above and therefore urine cultures were sent. Urine cultures have come back with Escherichia coli. The patient has had a significant drop in the WBC and has had no further fevers. At this point, antibiotics have been changed to Keflex, which is to be taken for an additional seven days for a total of 10 days for the Escherichia coli urinary tract infection. The patient's blood pressure remained stable. Last blood pressure was 122/67. Other medications were held except for the Lasix and he was given gentle IV fluids. Upon discharge the Lasix could also be resumed. While hospitalized, the patient was also noted to have some dysphagia symptoms and was seen by speech and swallow evaluation specialist and the diet has been changed to dysphagia I puree diet with honey-thick liquids without any straw. At this point he will be discharged back to his long-term care facility as he was also seen by physical therapy and did ambulate with them.  He did have a drop in hemoglobin which was  likely hemodilutional as the WBC, platelets, and hemoglobin all dropped. The patient is to follow with his primary care physician within a week.   DISPOSITION: Back to long-term care facility.   CODE STATUS: The patient is DO NOT RESUSCITATE.     TOTAL TIME SPENT: 35 minutes.    ____________________________ Krystal Eaton, MD sa:bjt D: 09/03/2012 10:38:38 ET T: 09/03/2012 11:00:34 ET JOB#: [id number]  cc: Krystal Eaton, MD, <Dictator> Krystal Eaton MD ELECTRONICALLY SIGNED 09/05/2012 15:11

## 2015-03-22 NOTE — H&P (Signed)
PATIENT NAME:  [name] Schwartz Schwartz, [name] Schwartz MR#:  [id number] DATE OF BIRTH:  1914/01/05  DATE OF ADMISSION:  09/01/2012  ADDENDUM   SOCIAL HISTORY: According to previous documents, the patient quit smoking 20 years ago. No alcohol drinking or illicit drugs. [facility].  FAMILY HISTORY: The patient's father died at age 79 of tuberculosis.   PAST SURGICAL HISTORY: Left hip hemiarthroplasty.  ____________________________ Shaune PollackQing Jaysie Benthall, MD qc:slb D: 09/01/2012 15:44:25 ET T: 09/01/2012 16:05:03 ET JOB#: [id number]  cc: Shaune PollackQing Hazelynn Mckenny, MD, <Dictator> Shaune PollackQING Renetta Suman MD ELECTRONICALLY SIGNED 09/01/2012 22:06

## 2015-03-22 NOTE — Consult Note (Signed)
PATIENT NAME:  [name] Schwartz, [name] Schwartz MR#:  [id number] DATE OF BIRTH:  09-Jul-1914  DATE OF CONSULTATION:  08/07/2012  REFERRING PHYSICIAN:  Dr [name] Schwartz CONSULTING PHYSICIAN:  [name] GessMichael E. Buzz Axel, MD  REASON FOR CONSULTATION: Bacteremia.   HISTORY OF PRESENT ILLNESS: The patient is a 81 year old white man with a past history significant for dementia who was admitted on 08/03/2012 with decreased responsiveness. The patient is a nursing home resident and was felt to be doing well the day prior to admission, however on the day of admission was poorly responsive. Was also noted to be weak.  He was brought to the emergency room. Evaluation at that time showed that he was afebrile, he had good blood pressure, and his saturations were 95% on 2 liters of oxygen. He had a normal white count and normal chest x-ray. He was empirically started on ceftriaxone and azithromycin for a possible pneumonia. He had blood cultures which are growing coagulase-negative staphylococcus in the blood. The coagulase-negative staphylococcus consists of 2 species Staphylococcus epidermidis and Staphylococcus hominis in 1 set and Staphylococcus epidermidis in 1 bottle of the other set. The patient is unable to provide any history. The history is obtained exclusively from the chart.   PAST MEDICAL HISTORY:  1. Alzheimer's dementia.  2. Hypertension.  3. Anemia.  4. Macular degeneration.  5. Glaucoma.  6. Peptic ulcer disease.  7. Status post left hip hemiarthroplasty.   SOCIAL HISTORY: The patient is a prior smoker, having quit 20 years ago. He does not drink.  He does not smoke. He lives in a memory unit in a skilled nursing facility. He is usually ambulatory and interactive but has not been during his hospitalization.   FAMILY HISTORY: Positive for tuberculosis and cardiac issues.   REVIEW OF SYSTEMS: Unable to obtain from the patient due to his underlying dementia.     PHYSICAL EXAMINATION:  VITAL SIGNS: T-max of 99.1, T-current of  99, pulse 100, blood pressure 114/69, 97% on 2 liters. Review of his saturations during his hospitalization have shown no saturations below 93%.   GENERAL: Thin 81 year old white man who opens his eyes but is not verbally responsive but in no acute distress.   HEENT: Normocephalic, atraumatic. Pupils equal, reactive to light. Extraocular motion intact. Sclerae, conjunctivae, and lids are without evidence for emboli or petechiae. Oropharynx shows normal lips and gums, difficult to assess posterior pharynx due to patient compliance.   NECK: Midline trachea. No lymphadenopathy. No thyromegaly.   CHEST:  Occasional crackles in bilateral bases but no focal consolidation.   HEART: Regular rate and rhythm without murmur, rub, or gallop.   ABDOMEN: Soft, nontender, nondistended. No hepatosplenomegaly. No hernias noted.   EXTREMITIES: No evidence for tenosynovitis.   SKIN: No rashes. No stigmata of endocarditis, specifically no Janeway lesions or Osler nodes.   NEUROLOGIC: The patient is awake but not verbally responsive. He would follow simple commands. He had decreased range of motion of his right hand, and his fingers were held flexed.  He was not moving his lower extremities very well.   PSYCHIATRIC: Unable to assess due to the patient's compliance.   LABORATORY DATA: BUN of 19, creatinine 1.12. White count of 7.1, hemoglobin 11.9, platelet count of 194,000.  ANC 5.1. These labs are from August 05, 2012, no more recent labs have been obtained. White count on admission was 6.1. Blood cultures on admission are growing Staphylococcus epidermidis and Staphylococcus hominis in 1 set and Staphylococcus epidermidis in 1 bottle of another set. Urinalysis was  negative. A portable chest x-ray from admission showed interstitial prominence accentuated by shallow inspiration; the possibility of underlying pulmonary edema or infiltrate could not be excluded. There was atelectasis present. A CT scan of the head  without contrast showed involutional changes without any acute abnormalities. A PA and lateral chest x-ray showed cardiomegaly with basilar atelectasis, trace effusion and mild interstitial edema suggestive of congestive heart failure.   IMPRESSION: A 81 year old white man with a history of dementia admitted with mental status changes who has coagulase-negative staphylococcus in the blood.   RECOMMENDATIONS:  1. His blood cultures are growing 2 different species of coagulase-negative staph. This is highly suggestive of contamination. Would not treat the positive blood cultures.  2. He presented without fever or leukocytosis. He has been on 2 liters of oxygen since admission but his saturations have been in the mid to upper 90s. His chest x-ray did not show infiltrates but edema. There is little to suggest pneumonia or any active infection.  3. We will stop all his antibiotics.  4. We will plan on signing off. Please feel free to call me with any further questions.  Thank you very much for involving me in [name] Schwartz's care.   This was a moderately complex infectious disease consult    ____________________________ [name] Gess. [name] Antuna, MD meb:vtd D: 08/07/2012 14:48:22 ET T: 08/08/2012 08:17:37 ET JOB#: [id number]  cc: [name] Gess. Denielle Bayard, MD, <Dictator> Vamsi Apfel E Tamzin Bertling MD ELECTRONICALLY SIGNED 08/08/2012 12:05

## 2015-03-25 NOTE — Discharge Summary (Signed)
PATIENT NAME:  [name] Schwartz, [name] Schwartz MR#:  [id number] DATE OF BIRTH:  [birth year]  DATE OF ADMISSION:  02/03/2013 DATE OF DISCHARGE:  02/05/2013  DISCHARGE DIAGNOSES:  1.  Syncope.  2.  Type II arteriovenous nodal block, resolved.  3.  Sepsis.  4.  Acute bronchitis.  5.  Acute renal failure.  6.  Dehydration.  7.  Severe dementia.   IMAGING STUDIES: CT of the head without contrast, which showed no acute bleed or stroke.   Chest x-ray showed bilateral pneumonitis versus bronchitis.   ADMITTING HISTORY AND PHYSICAL/HOSPITAL COURSE: Please see detailed H and P dictated on 02/03/2013. In brief, a 81 year old Caucasian male patient with history of severe dementia, who is nonverbal at baseline, who presented to the hospital with an episode of syncope while eating. The patient was found to have low blood pressure of 86/50, temperature of 100.4 previously seen in the Emergency Room and was admitted for sepsis and syncope workup.   HOSPITAL COURSE:  1.  Syncope. This was thought to be secondary to possible sepsis with hypotension or the type II AV block the patient has. That AV block had resolved and was likely secondary to sick sinus syndrome. The patient is DNR without any aggressive measures, discussed with daughter and the pacemaker has been deferred.  2.  Acute bronchitis with sepsis. The patient was started on antibiotics. The cultures from blood and urine have been negative. Sputum cultures could not be obtained. The patient has been afebrile in the hospital with blood pressure improving, saturating 96% on room air on the day of discharge with clear lungs and is being discharged.   DISCHARGE MEDICATIONS: Include:  1.  Omeprazole 20 mg oral once a day.  2.  Aspirin 81 mg oral once a day. 3.  Lasix 20 mg oral once a day.  4.  Senna laxative 0.6 oral once a day.  5.  Acetaminophen 500 mg oral every 4 hours as needed for pain.  6.  Nystatin 100,000 units apply to affected area twice a day as needed.   7.  Artificial tears 2 drops in each eye 3 times a day.  8.  Levaquin 250 mg oral once a day for 3 days.   DISCHARGE INSTRUCTIONS: The patient will be on a regular mechanical soft pureed meat diet. Activity as tolerated with assistance. Discharge to assisted living facility. Follow up with primary care physician in a week.   TIME SPENT ON DAY OF DISCHARGE ON DISCHARGE ACTIVITY: 40 minutes.    ____________________________ Molinda BailiffSrikar R. Annabelle Rexroad, MD srs:aw D: 02/05/2013 12:47:34 ET T: 02/05/2013 12:58:12 ET JOB#: [id number]  cc: Wardell HeathSrikar R. Dezmin Kittelson, MD, <Dictator> Orie FishermanSRIKAR R Soriyah Osberg MD ELECTRONICALLY SIGNED 02/10/2013 20:31

## 2015-03-25 NOTE — H&P (Signed)
PATIENT NAME:  [name] Schwartz MR#:  [id number] DATE OF BIRTH:  [birth year]  DATE OF ADMISSION:  02/03/2013  CHIEF COMPLAINT: Syncope.  HISTORY OF PRESENTING ILLNESS: The patient is a 81 year old Caucasian male patient with history of hypertension, severe dementia and first-degree AV block, presents to the Emergency Room sent in from the nursing home after the patient had an episode of brief syncope while eating lunch. The patient was seen in the Emergency Room yesterday with fever of 100.4, was diagnosed with acute bronchitis and sent home on Levaquin. The patient seemed to have done well until today afternoon. The patient is nonverbal, and on discussing with the daughter this is his baseline. The patient's blood pressures have been low with systolic of 86/50. Reviewing old medical records, the patient does seem to have baseline blood pressure in the 90s 100s systolic. He does not contribute to the history. Old records were reviewed and history obtained from the nursing home notes and discussing with ER physician and nursing staff.   The patient's chest x-ray shows possible bilateral pneumonitis versus pulmonary edema with urinalysis showing no bacteria.  EKG shows type II AV block which presently has resolved on the telemonitor.  CT scan of the head shows no acute abnormalities.   PAST MEDICAL HISTORY: Hypertension, chronic anemia, diastolic congestive heart failure, Alzheimer's dementia, peptic ulcer disease, decreased hearing and glaucoma.   REVIEW OF SYSTEMS: Unobtainable as the patient is nonverbal, confused.   ALLERGIES: BARBITURATES, CELEBREX AND CODEINE.   SOCIAL HISTORY: The patient is a resident of a nursing home. Does not smoke. No alcohol. No illicit drugs.   CODE STATUS: DNR and DNI.   HOME MEDICATIONS: 1. Artificial tears 2 drops in each eye 3 times a day.  2. Aspirin 81 mg oral once a day.  3. Lasix 20 mg oral once a day,  4. Levaquin 750 mg oral once a day, started on  02/02/2013.  5. Acetaminophen 500 mg oral every 4 hours as needed.  6. Nystatin topical cream as needed.  7. Omeprazole 20 mg oral once a day.  8. Senna 8.6 mg oral once a day.   FAMILY HISTORY: Reviewed and unknown.   PHYSICAL EXAMINATION: VITAL SIGNS: Temperature 100.4 on 02/02/2013 and 99.8 today, pulse of 68, respirations 20, blood pressure 86/50,  saturating 94% on room air.  GENERAL: Elderly, frail Caucasian male patient lying in bed, nonverbal but alert and awake. Has a flat affect.  HEENT: Atraumatic, normocephalic. Oral mucosa dry and pink. External ears and nose normal. No pallor. No icterus. Pupils bilaterally equal and reactive to light.  NECK: Supple. No thyromegaly. No palpable lymph nodes. Trachea midline. No carotid bruit, JVD.  CARDIOVASCULAR: S1 and S2 with a murmur and no edema.  RESPIRATORY: Normal work of breathing, has some crackles on the left lower base.  GASTROINTESTINAL: Soft abdomen, nontender. Bowel sounds present. No hepatosplenomegaly palpable.  GENITOURINARY: No CVA tenderness or bladder distention.  SKIN: Warm and dry. No petechiae, rash, ulcers.  MUSCULOSKELETAL: No joint swelling, redness, effusion of the large joints. Normal muscle tone.  NEUROLOGICAL: Seems to move all 4 extremities symmetrically.  LYMPHATIC: No cervical lymphadenopathy.   LABORATORY AND RADIOLOGICAL DATA:  Glucose of 126, with BNP of 14,700, BUN 28, creatinine 1.36, sodium 141, potassium 4.1, GFR of 44.0. CK of 16 with troponin 0.02. WBC 8.2, hemoglobin 10.4, platelets of 161. Urine and blood cultures from 02/02/2013 negative to date.  Urinalysis done today shows no bacteria.  EKG shows type II AV block  which has resolved on tele at this time.  Chest x-ray shows bilateral pulmonary pneumonitis versus pulmonary edema.  CT scan of the head showed generalized atrophy. No acute strokes.    ASSESSMENT AND PLAN: 1. Syncope: Likely secondary from the AV block or hypertension from sepsis.  The patient is a DNR/DNI with no aggressive measures. Presently, he is back in normal sinus rhythm and will not need a pacemaker. We will check 2 more sets of cardiac enzymes to look for any acute coronary syndrome.  2. Sepsis: Likely from the bronchitis.  Await blood cultures. Get sputum cultures. Start the patient on ceftriaxone and azithromycin. The patient will be on IV fluids. We will have to monitor for fluid overload as he does have elevated BNP and history of diastolic congestive heart failure, although the patient does not have any edema or JVD. I do not suspect any fluid overload at this time clinically.  3. Chronic anemia: Stable.  4. Severe dementia: Watch for any inpatient delirium.  5. Deep vein thrombosis prophylaxis with heparin.   CODE STATUS: DNR/DNI as per records from nursing home.   I have discussed the patient's care with his daughter, [name] Schwartz.   TIME SPENT TODAY ON THIS CASE: 55 minutes.   ____________________________ [name] BailiffSrikar R. Sudini, MD srs:cb D: 02/03/2013 17:47:05 ET T: 02/03/2013 18:11:22 ET JOB#: [id number]  cc: Wardell HeathSrikar R. Sudini, MD, <Dictator> Orie FishermanSRIKAR R SUDINI MD ELECTRONICALLY SIGNED 02/03/2013 19:15

## 2015-03-27 NOTE — Consult Note (Signed)
Brief Consult Note: Diagnosis: possible Syncope, sinus bradycardia.   Patient was seen by consultant.   Consult note dictated.   Discussed with Attending MD.   Comments: Agree with stopping Atenolol. HR improved. Recommend conservative management due to age and severe dementia.  Electronic Signatures: Lorine BearsArida, Muhammad (MD)  (Signed 15-Apr-13 16:21)  Authored: Brief Consult Note   Last Updated: 15-Apr-13 16:21 by Lorine BearsArida, Muhammad (MD)

## 2015-03-27 NOTE — Consult Note (Signed)
PATIENT NAME:  [name] SimpsonNDERSON, Chais MR#:  [id number] DATE OF BIRTH:  1914-03-22  DATE OF CONSULTATION:  03/17/2012  REFERRING PHYSICIAN:   CONSULTING PHYSICIAN:  Tivis Wherry A. Kirke CorinArida, MD  REASON FOR CONSULTATION: Syncope and bradycardia.   HISTORY OF PRESENT ILLNESS: This is a 81 year old gentleman with severe dementia who is a nursing home resident. He is not able to provide any history. He was brought to the emergency room after reportedly he passed out and had a syncopal episode. EMS found him to be bradycardic with a heart rate in the 40s. He was given 1 mg of atropine and his heart rate improved to the 60s. The patient takes atenolol 25 mg once daily for hypertension. There is no documented previous cardiac history. He was mildly hypotensive in the emergency room and responded to IV fluid boluses. At this time, the patient is not able to provide any history due to his baseline dementia.   PAST MEDICAL HISTORY:  1. Hypertension.  2. Alzheimer's dementia.  3. History of peptic ulcer disease.  4. Macular degeneration.  5. Glaucoma.  6. Hearing loss and vision loss.   PAST SURGICAL HISTORY: None.   ALLERGIES: Celebrex, codeine, and barbiturates.  SOCIAL HISTORY: He quit smoking 20 years ago. There is no history of alcohol or drug use.   FAMILY HISTORY: Unable to obtain at this time.   HOME MEDICATIONS:  1. Omeprazole 20 mg daily.  2. Aspirin 81 mg daily.  3. Atenolol 25 mg daily.  4. Vitamin B12 once daily.  5. Vitamin D once daily.  6. Senna 1 tablet twice daily.  7. Calcium and vitamin D four times daily.   REVIEW OF SYSTEMS: Unable to obtain at this time due to his severe dementia.   PHYSICAL EXAMINATION:   GENERAL: The patient appears to be at his stated age and in no acute distress.   VITAL SIGNS: Temperature is 98.4, pulse 57, respiratory rate 20, blood pressure 105/78, and oxygen saturation is 94% on 2 liters nasal cannula.   HEENT: Normocephalic, atraumatic.   NECK: No  jugular venous distention or carotid bruits.   RESPIRATORY: Normal respiratory effort with no use of accessory muscles. Auscultation reveals normal breath sounds.   CARDIOVASCULAR: Normal PMI. Normal S1 and S2 with no gallops or murmurs.   ABDOMEN: Benign, nontender, and nondistended.   EXTREMITIES: No clubbing, cyanosis, or edema.   LABS/STUDIES: His ECG on presentation showed sinus bradycardia with first-degree AV block and left anterior fascicular block.   His current ECG shows normal sinus rhythm with a heart rate of 67 beats per minute, first degree AV block, and left anterior fascicular block.   His labs show a creatinine of 0.88 and a BUN of 10. Cardiac enzymes were negative.   Hemoglobin was 11.4 and decreased to 9.8 with hydration.   IMPRESSION:  1. Possible syncope due to brady arrhythmic event.  2. Severe dementia.  3. Mild hypotension, which has resolved.   RECOMMENDATIONS: It appears that the patient had a syncopal episode. He was found to be mildly bradycardic which likely contributed. It is difficult to obtain any history from the patient due to his severe dementia. I agree with stopping atenolol at this time. His heart rate has improved since yesterday. The patient does have first degree AV block and left anterior fascicular block on his EKG and theoretically he might benefit from permanent pacemaker placement if he develops recurrent syncopal episodes with documented brady arrhythmic event in the future. However, due to his  advanced age and severe dementia, I recommend an overall conservative approach.  ____________________________ Chelsea Aus. Kirke Corin, MD maa:slb D: 03/17/2012 16:30:23 ET T: 03/17/2012 17:38:34 ET JOB#: [id number]  cc: Zarielle Cea A. Kirke Corin, MD, <Dictator> Iran Ouch MD ELECTRONICALLY SIGNED 03/22/2012 14:09

## 2015-03-27 NOTE — H&P (Signed)
PATIENT NAME:  [name] Schwartz, [name] Schwartz MR#:  [id number] DATE OF BIRTH:  10/27/1914  DATE OF ADMISSION:  03/16/2012  PRIMARY CARE PHYSICIAN: Merlene Pulling, PA at [facility]    CHIEF COMPLAINT: Passed out and bradycardia.   HISTORY OF PRESENT ILLNESS: This is a 81 year old guy with severe dementia, unable to give any history. History from ER physician who got it from EMS and facility. The patient slumped over. They called EMS. His heart rate was in the 40s. They did give a total of 1 mg of atropine and his heart rate came up into the 60s. Patient does take atenolol for hypertension. In the Emergency Room he was also found to be hypotensive which responded to a fluid bolus. Blood pressure on presentation 86/52. Hospitalist services were contacted for further evaluation. Patient unfortunately unable to give any pertinent history secondary to dementia. Son at the bedside also helped out with history.   PAST MEDICAL HISTORY:  1. Hypertension.  2. Alzheimer's dementia. 3. History of gastric ulcers. 4. Macular degeneration. 5. Glaucoma.  6. Decreased hearing. 7. Decreased vision.   PAST SURGICAL HISTORY: None.   ALLERGIES: In the computer to barbiturates, Celebrex and codeine.   SOCIAL HISTORY: Quit smoking 20 years ago. No alcohol. No drug use. Currently at [facility] memory unit. Usually does walk with a walker with assistance. Normally does not know family members.   FAMILY HISTORY: Father died at 38 of tuberculosis. Mother died of old age at age 20, heart gave out.   CURRENT MEDICATIONS:  1. Omeprazole 20 mg daily.  2. Aspirin 81 mg daily.  3. Atenolol 25 mg daily.  4. Vitamin B12 1000 mcg daily.  5. Vitamin D 1000 international units daily.  6. Senna 1 tablet twice a day.  7. Calcium and vitamin D 1 tablet 4 times a day.   REVIEW OF SYSTEMS: Unable to obtain secondary to severe dementia.   PHYSICAL EXAMINATION:  VITAL SIGNS: Vital signs on presentation included: Temperature  97.2, pulse 68, respirations 18, blood pressure 86/52, pulse oximetry 99% on room air. Blood pressure did respond to fluid bolus to 140/66.   GENERAL: No respiratory distress.   EYES: Conjunctivae normal. Lids normal. Pupils equal, round, and reactive to light. Unable to test extraocular muscles.   EARS, NOSE, MOUTH, AND THROAT: Nasal mucosa no erythema. Scab on the right external nares, nonhealing for a few weeks as per the son. Throat no exudates. Lips and gums no lesions.   NECK: No JVD. No bruits. No lymphadenopathy. No thyromegaly. No thyroid nodules palpated.   RESPIRATORY: Lungs clear to auscultation. No use of accessory muscles to breathe. No rhonchi, rales, or wheeze heard.   CARDIOVASCULAR: S1, S2 soft, 2/6 systolic ejection murmur. Carotid upstroke 2+ bilaterally. No bruits.   EXTREMITIES: Dorsalis pedis pulses 2+ bilaterally. Trace edema of the lower extremity.   ABDOMEN: Soft, nontender. No organomegaly/splenomegaly. Normoactive bowel sounds. No masses felt.   LYMPHATIC: No lymph nodes in the neck.   MUSCULOSKELETAL: No clubbing, edema, or cyanosis.   SKIN: Lesion on the right nostril nonhealing, just looks like a scab at this point.   NEUROLOGIC: Cranial nerves difficult to test secondary to dementia. Patient is moving all extremities on his own.   PSYCHIATRIC: Unable to test secondary to severe dementia.   LABORATORY, DIAGNOSTIC AND RADIOLOGICAL DATA: Urinalysis 3+ blood, red blood cells 1182, white blood cell count 7.5, hemoglobin and hematocrit 11.4 and 34.7, platelet count 194. Troponin negative. CT scan of the head: No  involutional changes. Chest x-ray: Shallow inspiration. Glucose 113, BUN 10, creatinine 0.88, sodium 135, potassium 4.8, chloride 103, CO2 22, calcium 8.4, albumin 3.1.   ASSESSMENT AND PLAN:  1. Syncope, symptomatic bradycardia and hypotension. I believe all three of these things are related. Will admit as an observation on the observation unit. Will  put on telemetry monitoring. Get serial cardiac enzymes. Hold atenolol. Would hold all medications that can cause rate lowering effects. Will give 1 liter IV fluid hydration and hold hypertensive medications at this point. In speaking with the son no aggressive management. Patient is a DO NOT RESUSCITATE. No pacemaker in an elderly man with dementia as per the son.  2. Severe dementia. Not on any medications for this.  3. History of hypertension. Hold all hypertensive medications.  4. History of gastric ulcer. Continue omeprazole.  5. Consider stopping all the other oral medications as outpatient.  6. Patient is on a puree diet with thickened liquids.   TIME SPENT ON ADMISSION: 45 minutes.   CODE STATUS: Patient is a DO NOT RESUSCITATE.   ____________________________ Herschell Dimesichard J. Renae GlossWieting, MD rjw:cms D: 03/16/2012 13:29:59 ET T: 03/16/2012 13:50:03 ET  JOB#: [id number] cc: Herschell Dimesichard J. Renae GlossWieting, MD, <Dictator> [facility] CenterMary Beth MontecitoMcGranaghan, [place] Salley ScarletICHARD J Gerrianne Aydelott MD ELECTRONICALLY SIGNED 03/17/2012 13:53

## 2015-03-27 NOTE — Discharge Summary (Signed)
PATIENT NAME:  [name] Schwartz, [name] Schwartz MR#:  [id number] DATE OF BIRTH:  09/09/1914  DATE OF ADMISSION:  03/16/2012 DATE OF DISCHARGE:  03/17/2012  DISCHARGE DIAGNOSES: Syncope/symptomatic bradycardia/hypotensive, much improved. Atenolol being stopped.   SECONDARY DIAGNOSES:  1. Hypertension.  2. Alzheimer's dementia. 3. History of gastric ulcer.  4. Macular degeneration.  5. Glaucoma.  6. Decreased hearing and vision.   CONSULTATION:  1. Physical therapy.  2. Cardiology, Dr. Kirke CorinArida.   LABORATORY, DIAGNOSTIC, AND RADIOLOGICAL DATA: CT scan of the head without contrast on April 14th showed no acute abnormalities.   Chest x-ray on April 14th showed no acute cardiopulmonary disease.   Major laboratory panel: Urinalysis on admission showed 1 WBC, otherwise negative.   HISTORY AND SHORT HOSPITAL COURSE: The patient is a 81 year old male with the above-mentioned medical problems who was admitted for syncope/symptomatic bradycardia/hypotension. The patient's heart rate was in the 40s. He was given 1 mg of atropine by EMS. Please see Dr. Mathews RobinsonsWieting's dictated history and physical for further details. Considering his age and severe dementia, permanent pacemaker or any intervention was not planned. Physical therapy consult was obtained who recommended discharge back to the facility. Cardiology consultation was obtained with Dr. Kirke CorinArida who agreed with stopping atenolol as this was already improving heart rate and blood pressure both. The patient was doing much better and was close to baseline and [facility]   VITAL SIGNS: On the date of discharge, his vital signs were as follows: Temperature 98.4, heart rate 67 per minute, respirations 20 per minute, blood pressure 128/78 mm/Hg. He was saturating 94% on room air.   PERTINENT PHYSICAL EXAMINATION: CARDIOVASCULAR: S1, S2 normal. No murmur, rubs, or gallop. LUNGS: Clear to auscultation bilaterally. No rales, rhonchi, or  crepitation. ABDOMEN: Soft, benign. NEUROLOGIC: Nonfocal examination. All other physical examination remained at baseline.   DISCHARGE MEDICATIONS:  1. Milk of magnesia 30 mL twice a day as needed.  2. Maalox 30 mL every six hours as needed.  3. Vitamin D3 1000 international units once daily.  4. Aspirin 81 mg p.o. daily.  5. Zinc oxide 3 times a day as needed.  6. Loratadine 10 mg p.o. daily as needed. 7. Senokot S twice a day  8. Calcium with vitamin D one tablet p.o. 4 times a day.  9. Tylenol 500 mg 1 tablet p.o. every four hours.  10. Prilosec 20 mg p.o. daily.   DISCHARGE DIET: Low sodium, puree with thickened liquids.   DISCHARGE ACTIVITY: As tolerated.   DISCHARGE INSTRUCTIONS AND FOLLOW-UP: The patient was instructed to follow-up with his primary care physician, Merlene Pull[facility]Mary Beth [facility] 1 to 2 weeks. He will need follow-up with cardiology, Dr. Kirke CorinArida, in 2 to 3 weeks. He was instructed to stop atenolol.   TOTAL TIME DISCHARGING THIS PATIENT: 45 minutes.  ____________________________ Ellamae SiaVipul S. Sherryll BurgerShah, MD vss:rbg D: 03/19/2012 09:40:36 ET  T: 03/20/2012 10:34:24 ET  JOB#: [id number]  cc: [address]Mary Beth CaruthersvilleMcGranaghan, [place]PA Muhammad A. Kirke CorinArida, MD Patricia PesaVIPUL S Mylinda Brook MD ELECTRONICALLY SIGNED 03/20/2012 13:21
